# Patient Record
Sex: Female | Born: 1985 | Race: Black or African American | Hispanic: Yes | Marital: Married | Smoking: Current every day smoker
Health system: Southern US, Community
[De-identification: ages and names within clinical notes are randomized; demographics above are authoritative.]

## PROBLEM LIST (undated history)

## (undated) DIAGNOSIS — M5412 Radiculopathy, cervical region: Secondary | ICD-10-CM

## (undated) DIAGNOSIS — M25861 Other specified joint disorders, right knee: Secondary | ICD-10-CM

## (undated) DIAGNOSIS — I10 Essential (primary) hypertension: Secondary | ICD-10-CM

## (undated) DIAGNOSIS — M1711 Unilateral primary osteoarthritis, right knee: Secondary | ICD-10-CM

## (undated) DIAGNOSIS — F419 Anxiety disorder, unspecified: Secondary | ICD-10-CM

## (undated) DIAGNOSIS — O139 Gestational [pregnancy-induced] hypertension without significant proteinuria, unspecified trimester: Secondary | ICD-10-CM

## (undated) DIAGNOSIS — S83271A Complex tear of lateral meniscus, current injury, right knee, initial encounter: Secondary | ICD-10-CM

## (undated) DIAGNOSIS — S63629A Sprain of interphalangeal joint of unspecified thumb, initial encounter: Secondary | ICD-10-CM

## (undated) DIAGNOSIS — S6992XA Unspecified injury of left wrist, hand and finger(s), initial encounter: Secondary | ICD-10-CM

## (undated) DIAGNOSIS — Z789 Other specified health status: Secondary | ICD-10-CM

## (undated) DIAGNOSIS — M25542 Pain in joints of left hand: Secondary | ICD-10-CM

## (undated) HISTORY — DX: Essential (primary) hypertension: I10

## (undated) HISTORY — PX: FRACTURE SURGERY: SHX138

## (undated) HISTORY — PX: CYSTECTOMY: SUR359

## (undated) HISTORY — DX: Anxiety disorder, unspecified: F41.9

---
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
## 2016-11-19 ENCOUNTER — Emergency Department: Payer: Managed Care, Other (non HMO)

## 2016-11-19 ENCOUNTER — Emergency Department
Admission: EM | Admit: 2016-11-19 | Discharge: 2016-11-19 | Disposition: A | Discharge status: Home / Self Care | Payer: Managed Care, Other (non HMO) | Attending: Student in an Organized Health Care Education/Training Program | Admitting: Student in an Organized Health Care Education/Training Program

## 2016-11-19 ENCOUNTER — Encounter: Payer: Self-pay | Admitting: *Deleted

## 2016-11-19 DIAGNOSIS — Y939 Activity, unspecified: Secondary | ICD-10-CM | POA: Diagnosis not present

## 2016-11-19 DIAGNOSIS — X501XXA Overexertion from prolonged static or awkward postures, initial encounter: Secondary | ICD-10-CM | POA: Insufficient documentation

## 2016-11-19 DIAGNOSIS — S92211A Displaced fracture of cuboid bone of right foot, initial encounter for closed fracture: Secondary | ICD-10-CM | POA: Diagnosis not present

## 2016-11-19 DIAGNOSIS — S99911A Unspecified injury of right ankle, initial encounter: Secondary | ICD-10-CM | POA: Diagnosis present

## 2016-11-19 DIAGNOSIS — Y999 Unspecified external cause status: Secondary | ICD-10-CM | POA: Diagnosis not present

## 2016-11-19 DIAGNOSIS — Y929 Unspecified place or not applicable: Secondary | ICD-10-CM | POA: Insufficient documentation

## 2016-11-19 MED ORDER — HYDROCODONE-ACETAMINOPHEN 5-325 MG PO TABS: 1.0000 | ORAL_TABLET | Freq: Four times a day (QID) | ORAL | 0 refills | Status: DC | PRN

## 2016-11-19 MED ORDER — KETOROLAC TROMETHAMINE 30 MG/ML IJ SOLN
30.0000 mg | Freq: Once | INTRAMUSCULAR | Status: AC
  Administered 2016-11-19: 30 mg via INTRAMUSCULAR
  Filled 2016-11-19: qty 1

## 2016-11-19 NOTE — ED Notes (Signed)
See triage note  States she slipped last pm twisted right ankle/foot  Swelling noted across top of foot  positive pulses

## 2016-11-19 NOTE — ED Notes (Signed)
NAD noted at time of D/C. Pt taken to the lobby via wheelchair by this RN.

## 2016-11-19 NOTE — ED Triage Notes (Signed)
States she rolled her ankle last night while taking the trash out, states right ankle pain at present

## 2016-11-19 NOTE — ED Provider Notes (Signed)
[facility] Emergency Department Provider Note  ____________________________________________  Time seen: Approximately 11:27 AM  I have reviewed the triage vital signs and the nursing notes.   HISTORY  Chief Complaint Ankle Pain    HPI Melissa Dixon is a 31 y.o. female , NAD, presents to the emergency department for evaluation of right ankle and foot pain. Patient states she twisted her ankle last night around 6 PM. Since that time has had swelling, bruising and significant pain about the right ankle and top of her foot. Denies any numbness, weakness or tingling. Has not noted any open wounds or lacerations. Denies any visual changes, chest pain, shortness breath, lightheadedness or dizziness to cause the injury and states the injury was mechanical in nature. Has not been able bear weight without significant pain since the injury.   History reviewed. No pertinent past medical history.  There are no active problems to display for this patient.   No past surgical history on file.  Prior to Admission medications   Medication Sig Start Date End Date Taking? Authorizing Provider  HYDROcodone-acetaminophen (NORCO) 5-325 MG tablet Take 1 tablet by mouth every 6 (six) hours as needed for severe pain. 11/19/16   Jami L Hagler, PA-C    Allergies Patient has no known allergies.  History reviewed. No pertinent family history.  Social History Social History  Substance Use Topics  . Smoking status: Not on file  . Smokeless tobacco: Not on file  . Alcohol use Not on file     Review of Systems  Constitutional: No fatigue Eyes: No visual changes. Musculoskeletal: Positive for right ankle and foot pain. Negative for back, hip, knee pain.  Skin: Positive swelling and bruising right foot and ankle.  Negative for rash, redness, abnormal warmth, open wounds or lacerations. Neurological: Negative for numbness, weakness,  tingling.  ____________________________________________   PHYSICAL EXAM:  VITAL SIGNS: ED Triage Vitals  Enc Vitals Group     BP 11/19/16 0957 128/72     Pulse Rate 11/19/16 0957 92     Resp 11/19/16 0957 18     Temp 11/19/16 0957 98.8 F (37.1 C)     Temp Source 11/19/16 0957 Oral     SpO2 11/19/16 0957 98 %     Weight 11/19/16 0954 240 lb (108.9 kg)     Height 11/19/16 0954 5\' 7"  (1.702 m)     Head Circumference --      Peak Flow --      Pain Score 11/19/16 0954 10     Pain Loc --      Pain Edu? --      Excl. in [place]? --      Constitutional: Alert and oriented. Well appearing and in no acute distress. Eyes: Conjunctivae are normal.  Head: Atraumatic. Cardiovascular: Good peripheral circulation with 2+ pulses noted in the right lower extremity. Capillary refill is brisk in all digits of the right foot. Respiratory: Normal respiratory effort without tachypnea or retractions.  Musculoskeletal: Tenderness to palpation about the lateral malleolus and proximal and lateral portion of the right foot. Decreased range of motion of the right ankle due to pain and swelling. No crepitus to palpation of the ankle or foot. No laxity with anterior or posterior 4 of the right ankle. No laxity with varus or valgus stress of the right ankle. Range of motion of the toes on the right foot without difficulty. No lower leg tenderness nor edema.  No joint effusions. Neurologic:  Normal speech and  language. No gross focal neurologic deficits are appreciated. Light touch grossly intact about the right lower extremity. Skin:  Skin about the lateral, proximal and dorsal portion of the right foot with diffuse blue ecchymosis and swelling. No open wounds or lacerations. No skin sores. Skin is warm, dry and intact. No rash noted. Psychiatric: Mood and affect are normal. Speech and behavior are normal. Patient exhibits appropriate insight and judgement.   ____________________________________________    LABS  None ____________________________________________  EKG  None ____________________________________________  RADIOLOGY I, Hodgeman, personally viewed and evaluated these images (plain radiographs) as part of my medical decision making, as well as reviewing the written report by the radiologist.  Dg Ankle Complete Right  Result Date: 11/19/2016 CLINICAL DATA:  Pain following twisting injury EXAM: RIGHT ANKLE - COMPLETE 3+ VIEW COMPARISON:  None. FINDINGS: Frontal, oblique, and lateral views were obtained. There is a linear focus of calcification between the cuboid and fifth metatarsal, seen only on the lateral view. A small avulsion in this area must be of concern. No other evidence of potential fracture. No appreciable joint effusion. The ankle mortise appears intact. No appreciable joint space narrowing. IMPRESSION: Questionable avulsion lateral to the cuboid seen only on the lateral view. No other evidence of potential fracture. Ankle mortise appears intact. No appreciable joint space narrowing or erosion. Electronically Signed   By: Lowella Grip III M.D.   On: 11/19/2016 10:54    ____________________________________________    PROCEDURES  Procedure(s) performed: None   Procedures   Medications  ketorolac (TORADOL) 30 MG/ML injection 30 mg (30 mg Intramuscular Given 11/19/16 1153)     ____________________________________________   INITIAL IMPRESSION / ASSESSMENT AND PLAN / ED COURSE  Pertinent labs & imaging results that were available during my care of the patient were reviewed by me and considered in my medical decision making (see chart for details).     Patient's diagnosis is consistent with closed displaced fracture of the cuboid of the right foot. Patient was placed in a posterior splint and given crutches for supportive care. Patient will be discharged home with prescriptions for Norco to take sparingly as needed for pain. Patient is to follow up  with Dr. Roland Rack in orthopedics in 2-3 days for further evaluation and treatment of fracture. Patient is given ED precautions to return to the ED for any worsening or new symptoms.    ____________________________________________  FINAL CLINICAL IMPRESSION(S) / ED DIAGNOSES  Final diagnoses:  Closed displaced fracture of cuboid of right foot, initial encounter      NEW MEDICATIONS STARTED DURING THIS VISIT:  Discharge Medication List as of 11/19/2016 12:38 PM    START taking these medications   Details  HYDROcodone-acetaminophen (NORCO) 5-325 MG tablet Take 1 tablet by mouth every 6 (six) hours as needed for severe pain., Starting Wed 11/19/2016, [place], PA-C 11/19/16 1306    Merlyn Lot, MD 11/19/16 1357

## 2018-04-27 ENCOUNTER — Other Ambulatory Visit: Payer: Self-pay | Admitting: Orthopedic Surgery

## 2018-04-27 DIAGNOSIS — R609 Edema, unspecified: Secondary | ICD-10-CM

## 2018-04-27 DIAGNOSIS — R229 Localized swelling, mass and lump, unspecified: Secondary | ICD-10-CM

## 2018-04-27 DIAGNOSIS — M7989 Other specified soft tissue disorders: Secondary | ICD-10-CM

## 2018-04-27 DIAGNOSIS — R2241 Localized swelling, mass and lump, right lower limb: Secondary | ICD-10-CM

## 2018-04-27 DIAGNOSIS — IMO0002 Reserved for concepts with insufficient information to code with codable children: Secondary | ICD-10-CM

## 2018-04-29 ENCOUNTER — Ambulatory Visit
Admission: RE | Admit: 2018-04-29 | Discharge: 2018-04-29 | Disposition: A | Discharge status: Home / Self Care | Payer: 59 | Source: Ambulatory Visit | Attending: Orthopedic Surgery | Admitting: Orthopedic Surgery

## 2018-04-29 DIAGNOSIS — R2241 Localized swelling, mass and lump, right lower limb: Secondary | ICD-10-CM | POA: Insufficient documentation

## 2018-04-29 DIAGNOSIS — M7989 Other specified soft tissue disorders: Secondary | ICD-10-CM

## 2019-01-24 ENCOUNTER — Emergency Department: Payer: 59

## 2019-01-24 ENCOUNTER — Emergency Department
Admission: EM | Admit: 2019-01-24 | Discharge: 2019-01-24 | Disposition: A | Discharge status: Home / Self Care | Payer: 59 | Attending: Student in an Organized Health Care Education/Training Program | Admitting: Student in an Organized Health Care Education/Training Program

## 2019-01-24 ENCOUNTER — Encounter: Payer: Self-pay | Admitting: Emergency Medicine

## 2019-01-24 DIAGNOSIS — F17200 Nicotine dependence, unspecified, uncomplicated: Secondary | ICD-10-CM | POA: Insufficient documentation

## 2019-01-24 DIAGNOSIS — R1033 Periumbilical pain: Secondary | ICD-10-CM | POA: Diagnosis present

## 2019-01-24 DIAGNOSIS — L03316 Cellulitis of umbilicus: Secondary | ICD-10-CM | POA: Insufficient documentation

## 2019-01-24 LAB — CBC WITH DIFFERENTIAL/PLATELET
Abs Immature Granulocytes: 0.06 10*3/uL (ref 0.00–0.07)
Basophils Absolute: 0 10*3/uL (ref 0.0–0.1)
Basophils Relative: 0 %
Eosinophils Absolute: 0.3 10*3/uL (ref 0.0–0.5)
Eosinophils Relative: 2 %
HCT: 40.4 % (ref 36.0–46.0)
Hemoglobin: 13.2 g/dL (ref 12.0–15.0)
Immature Granulocytes: 0 %
Lymphocytes Relative: 26 %
Lymphs Abs: 3.6 10*3/uL (ref 0.7–4.0)
MCH: 26.7 pg (ref 26.0–34.0)
MCHC: 32.7 g/dL (ref 30.0–36.0)
MCV: 81.6 fL (ref 80.0–100.0)
Monocytes Absolute: 0.8 10*3/uL (ref 0.1–1.0)
Monocytes Relative: 6 %
Neutro Abs: 9.4 10*3/uL — ABNORMAL HIGH (ref 1.7–7.7)
Neutrophils Relative %: 66 %
Platelets: 241 10*3/uL (ref 150–400)
RBC: 4.95 MIL/uL (ref 3.87–5.11)
RDW: 15.2 % (ref 11.5–15.5)
WBC: 14.2 10*3/uL — ABNORMAL HIGH (ref 4.0–10.5)
nRBC: 0 % (ref 0.0–0.2)

## 2019-01-24 LAB — URINALYSIS, COMPLETE (UACMP) WITH MICROSCOPIC
Bilirubin Urine: NEGATIVE
Glucose, UA: NEGATIVE mg/dL
Ketones, ur: NEGATIVE mg/dL
Nitrite: NEGATIVE
Protein, ur: NEGATIVE mg/dL
Specific Gravity, Urine: 1.024 (ref 1.005–1.030)
pH: 5 (ref 5.0–8.0)

## 2019-01-24 LAB — COMPREHENSIVE METABOLIC PANEL
ALT: 17 U/L (ref 0–44)
AST: 17 U/L (ref 15–41)
Albumin: 4.6 g/dL (ref 3.5–5.0)
Alkaline Phosphatase: 71 U/L (ref 38–126)
Anion gap: 10 (ref 5–15)
BUN: 17 mg/dL (ref 6–20)
CO2: 24 mmol/L (ref 22–32)
Calcium: 9.4 mg/dL (ref 8.9–10.3)
Chloride: 102 mmol/L (ref 98–111)
Creatinine, Ser: 0.78 mg/dL (ref 0.44–1.00)
GFR calc Af Amer: >60 mL/min (ref >60)
GFR calc non Af Amer: >60 mL/min (ref >60)
Glucose, Bld: 91 mg/dL (ref 70–99)
Potassium: 3.4 mmol/L — ABNORMAL LOW (ref 3.5–5.1)
Sodium: 136 mmol/L (ref 135–145)
Total Bilirubin: 0.4 mg/dL (ref 0.3–1.2)
Total Protein: 8.3 g/dL — ABNORMAL HIGH (ref 6.5–8.1)

## 2019-01-24 LAB — LACTIC ACID, PLASMA: Lactic Acid, Venous: 0.7 mmol/L (ref 0.5–1.9)

## 2019-01-24 LAB — POCT PREGNANCY, URINE: Preg Test, Ur: NEGATIVE

## 2019-01-24 MED ORDER — CLINDAMYCIN HCL 300 MG PO CAPS: 300.0000 mg | ORAL_CAPSULE | Freq: Four times a day (QID) | ORAL | 0 refills | Status: DC

## 2019-01-24 MED ORDER — IOPAMIDOL (ISOVUE-300) INJECTION 61%
30.0000 mL | Freq: Once | INTRAVENOUS | Status: DC
  Filled 2019-01-24: qty 30

## 2019-01-24 MED ORDER — HYDROCODONE-ACETAMINOPHEN 5-325 MG PO TABS: 1.0000 | ORAL_TABLET | ORAL | 0 refills | Status: DC | PRN

## 2019-01-24 MED ORDER — CLINDAMYCIN PHOSPHATE 600 MG/50ML IV SOLN
600.0000 mg | Freq: Once | INTRAVENOUS | Status: AC
  Administered 2019-01-24: 20:00:00 600 mg via INTRAVENOUS
  Filled 2019-01-24: qty 50

## 2019-01-24 MED ORDER — IOHEXOL 300 MG/ML  SOLN
125.0000 mL | Freq: Once | INTRAMUSCULAR | Status: AC | PRN
  Administered 2019-01-24: 125 mL via INTRAVENOUS
  Filled 2019-01-24: qty 125

## 2019-01-24 MED ORDER — IOHEXOL 240 MG/ML SOLN
50.0000 mL | Freq: Once | INTRAMUSCULAR | Status: AC | PRN
  Administered 2019-01-24: 20:00:00 50 mL via ORAL
  Filled 2019-01-24: qty 50

## 2019-01-24 NOTE — ED Notes (Signed)
Patient states having discomfort out on navel over weekend, today started having some discharge that has a smell with clear. Patient went to Urgent care and was referred to ER.

## 2019-01-24 NOTE — ED Provider Notes (Signed)
[facility] Emergency Department Provider Note  ____________________________________________  Time seen: Approximately 7:54 PM  I have reviewed the triage vital signs and the nursing notes.   HISTORY  Chief Complaint Abdominal Pain    HPI Melissa Dixon is a 33 y.o. female who presents emergency department complaining of periumbilical pain with purulent drainage from umbilicus.  Patient reports that she first noticed periumbilical pain beginning 3 days ago.  Patient reports that it was mild to begin with.  Pain has increased and over the past day she is also noticed purulent drainage from the umbilicus.  Patient reports pain with any bending at the waist, sitting down.  She can only be comfortable standing up or laying perfectly flat.  Patient denies any appreciable fever.  No nausea, vomiting, diarrhea or constipation.  No anorexia.  Patient denies any urinary symptoms.  No history of chronic abdominal complaints.  Patient reports the pain is both sharp as well as a aching sensation.  She denies any chance of pregnancy.         History reviewed. No pertinent past medical history.  There are no active problems to display for this patient.   History reviewed. No pertinent surgical history.  Prior to Admission medications   Medication Sig Start Date End Date Taking? Authorizing Provider  clindamycin (CLEOCIN) 300 MG capsule Take 1 capsule (300 mg total) by mouth 4 (four) times daily. 01/24/19   Isadore Palecek, Charline Bills, PA-C  HYDROcodone-acetaminophen (NORCO/VICODIN) 5-325 MG tablet Take 1 tablet by mouth every 4 (four) hours as needed for moderate pain. 01/24/19   Stalin Gruenberg, Charline Bills, PA-C    Allergies Patient has no known allergies.  History reviewed. No pertinent family history.  Social History Social History   Tobacco Use  . Smoking status: Current Every Day Smoker  . Smokeless tobacco: Never Used  Substance Use Topics  . Alcohol use: Not on file   . Drug use: Not on file     Review of Systems  Constitutional: No fever/chills Eyes: No visual changes.  Cardiovascular: no chest pain. Respiratory: no cough. No SOB. Gastrointestinal: Central/periumbilical abdominal pain.  No nausea, no vomiting.  No diarrhea.  No constipation. Genitourinary: Negative for dysuria. No hematuria Musculoskeletal: Negative for musculoskeletal pain. Skin: Negative for rash, abrasions, lacerations, ecchymosis.  Positive for purulent drainage from the umbilicus. Neurological: Negative for headaches, focal weakness or numbness. 10-point ROS otherwise negative.  ____________________________________________   PHYSICAL EXAM:  VITAL SIGNS: ED Triage Vitals [01/24/19 1928]  Enc Vitals Group     BP (!) 144/71     Pulse Rate 83     Resp 18     Temp 98.1 F (36.7 C)     Temp Source Oral     SpO2 99 %     Weight      Height      Head Circumference      Peak Flow      Pain Score      Pain Loc      Pain Edu?      Excl. in [place]?      Constitutional: Alert and oriented. Well appearing and in no acute distress. Eyes: Conjunctivae are normal. PERRL. EOMI. Head: Atraumatic. Neck: No stridor.   Hematological/Lymphatic/Immunilogical: No cervical lymphadenopathy. Cardiovascular: Normal rate, regular rhythm. Normal S1 and S2.  Good peripheral circulation. Respiratory: Normal respiratory effort without tachypnea or retractions. Lungs CTAB. Good air entry to the bases with no decreased or absent breath sounds. Gastrointestinal: Visualization of  the abdominal wall reveals no gross erythema or edema.  Visualization of the umbilicus reveals purulent drainage in the umbilicus.  No gross erythema or edema of the umbilicus.  No appreciable umbilical hernia.  Patient is very tender to palpation in the umbilicus topically.  Bowel sounds 4 quadrants.  Soft to palpation all quadrants.  Patient is tender to palpation in the periumbilical region extending into the right  lower quadrant.. No guarding or rigidity. No palpable masses. No distention. No CVA tenderness. Musculoskeletal: Full range of motion to all extremities. No gross deformities appreciated. Neurologic:  Normal speech and language. No gross focal neurologic deficits are appreciated.  Skin:  Skin is warm, dry and intact. No rash noted. Psychiatric: Mood and affect are normal. Speech and behavior are normal. Patient exhibits appropriate insight and judgement.   ____________________________________________   LABS (all labs ordered are listed, but only abnormal results are displayed)  Labs Reviewed  COMPREHENSIVE METABOLIC PANEL - Abnormal; Notable for the following components:      Result Value   Potassium 3.4 (*)    Total Protein 8.3 (*)    All other components within normal limits  CBC WITH DIFFERENTIAL/PLATELET - Abnormal; Notable for the following components:   WBC 14.2 (*)    Neutro Abs 9.4 (*)    All other components within normal limits  URINALYSIS, COMPLETE (UACMP) WITH MICROSCOPIC - Abnormal; Notable for the following components:   Color, Urine YELLOW (*)    APPearance HAZY (*)    Hgb urine dipstick SMALL (*)    Leukocytes,Ua TRACE (*)    Bacteria, UA RARE (*)    All other components within normal limits  CULTURE, BLOOD (ROUTINE X 2)  CULTURE, BLOOD (ROUTINE X 2)  LACTIC ACID, PLASMA  POC URINE PREG, ED  POCT PREGNANCY, URINE   ____________________________________________  EKG   ____________________________________________  RADIOLOGY I personally viewed and evaluated these images as part of my medical decision making, as well as reviewing the written report by the radiologist.  Ct Abdomen Pelvis W Contrast  Result Date: 01/24/2019 CLINICAL DATA:  Periumbilical pain with drainage for 2 days EXAM: CT ABDOMEN AND PELVIS WITH CONTRAST TECHNIQUE: Multidetector CT imaging of the abdomen and pelvis was performed using the standard protocol following bolus administration of  intravenous contrast. CONTRAST:  125 mL Omnipaque 300 COMPARISON:  None. FINDINGS: Lower chest: No acute abnormality. Hepatobiliary: Scattered small hypodensities are noted within the liver likely representing small cysts but too small for characterization. The gallbladder is within normal limits. Pancreas: Unremarkable. No pancreatic ductal dilatation or surrounding inflammatory changes. Spleen: Normal in size without focal abnormality. Adrenals/Urinary Tract: The adrenal glands are within. Kidneys are well visualized and within normal limits. No renal calculi are noted. No obstructive changes are seen. The bladder is partially distended. Stomach/Bowel: Stomach is within normal limits. Appendix appears normal. No evidence of bowel wall thickening, distention, or inflammatory changes. Vascular/Lymphatic: No significant vascular findings are present. No enlarged abdominal or pelvic lymph nodes. Reproductive: Uterus and bilateral adnexa are unremarkable. Essure coils are noted bilaterally. Other: No abdominal wall hernia or abnormality. No abdominopelvic ascites. No fluid collection is noted in the region of umbilicus. Musculoskeletal: No acute or significant osseous findings. IMPRESSION: No acute abnormality noted. Specifically no focal abnormality to correspond with the umbilical symptoms. Electronically Signed   By: Inez Catalina M.D.   On: 01/24/2019 21:39    ____________________________________________    PROCEDURES  Procedure(s) performed:    Procedures    Medications  clindamycin (CLEOCIN) IVPB 600 mg (0 mg Intravenous Stopped 01/24/19 2133)  iohexol (OMNIPAQUE) 240 MG/ML injection 50 mL (50 mLs Oral Contrast Given 01/24/19 2022)  iohexol (OMNIPAQUE) 300 MG/ML solution 125 mL (125 mLs Intravenous Contrast Given 01/24/19 2122)     ____________________________________________   INITIAL IMPRESSION / ASSESSMENT AND PLAN / ED COURSE  Pertinent labs & imaging results that were available during  my care of the patient were reviewed by me and considered in my medical decision making (see chart for details).  Review of the [place] CSRS was performed in accordance of the [place] prior to dispensing any controlled drugs.  Clinical Course as of Jan 24 2155  Mon Jan 24, 2019  2001 Patient presented to the emergency department complaining of periumbilical pain with purulent drainage from the umbilicus.  On exam, patient did have purulent drainage identified.  Patient has no erythema or signs of cellulitis in the periumbilical region.  No fluctuance with palpation in this region.  Palpation does reveal periumbilical tenderness as well as tenderness extending into the right lower quadrant.  Patient denies any fevers or chills, nausea or vomiting, diarrhea or constipation.  No urinary symptoms.  At this time, patient will be evaluated with labs, CT scan of the abdomen and pelvis.  Differential includes cellulitis, umbilical abscess, umbilical hernia with strangulation, gastritis, appendicitis.   [JC]    Clinical Course User Index [JC] Quade Ramirez, Charline Bills, PA-C          Patient's diagnosis is consistent with cellulitis of the umbilicus.  Patient presented to the emergency department with periumbilical pain, drainage from the umbilicus.  Patient did have tenderness to palpation with obvious purulent drainage.  Given patient's complaint, physical exam findings, patient had labs, imaging to ensure no deep abscess.  CT returns with reassuring results with no loculated fluid collection or signs of intra-abdominal infection.  Labs were otherwise reassuring.  Patient will be placed on clindamycin for umbilical cellulitis.  Small prescription of Vicodin for pain is also prescribed.  Follow-up with primary care as needed. Patient is given ED precautions to return to the ED for any worsening or new symptoms.     ____________________________________________  FINAL CLINICAL IMPRESSION(S) / ED DIAGNOSES  Final  diagnoses:  Cellulitis of umbilicus      NEW MEDICATIONS STARTED DURING THIS VISIT:  ED Discharge Orders         Ordered    clindamycin (CLEOCIN) 300 MG capsule  4 times daily     01/24/19 2154    HYDROcodone-acetaminophen (NORCO/VICODIN) 5-325 MG tablet  Every 4 hours PRN     01/24/19 2154              This chart was dictated using voice recognition software/Dragon. Despite best efforts to proofread, errors can occur which can change the meaning. Any change was purely unintentional.    Darletta Moll, PA-C 01/24/19 2156    Merlyn Lot, MD 01/24/19 2258

## 2019-01-24 NOTE — ED Triage Notes (Signed)
Pt reports pain inside of naval that started on Saturday and discharge noted by pt on Sunday. Foal odor on assessment to area. No redness but pain by pt when area pressed.

## 2019-01-29 LAB — CULTURE, BLOOD (ROUTINE X 2)
Culture: NO GROWTH
Culture: NO GROWTH
Special Requests: ADEQUATE
Special Requests: ADEQUATE

## 2019-07-14 ENCOUNTER — Other Ambulatory Visit: Payer: Self-pay

## 2019-07-14 ENCOUNTER — Encounter: Payer: Self-pay | Admitting: Emergency Medicine

## 2019-07-14 ENCOUNTER — Emergency Department
Admission: EM | Admit: 2019-07-14 | Discharge: 2019-07-14 | Disposition: A | Discharge status: Home / Self Care | Payer: 59 | Attending: Emergency Medicine | Admitting: Emergency Medicine

## 2019-07-14 DIAGNOSIS — F17210 Nicotine dependence, cigarettes, uncomplicated: Secondary | ICD-10-CM | POA: Insufficient documentation

## 2019-07-14 DIAGNOSIS — W2105XA Struck by basketball, initial encounter: Secondary | ICD-10-CM | POA: Diagnosis not present

## 2019-07-14 DIAGNOSIS — K0889 Other specified disorders of teeth and supporting structures: Secondary | ICD-10-CM | POA: Insufficient documentation

## 2019-07-14 MED ORDER — IBUPROFEN 600 MG PO TABS: 600.0000 mg | ORAL_TABLET | Freq: Three times a day (TID) | ORAL | 0 refills | Status: DC | PRN

## 2019-07-14 MED ORDER — AMOXICILLIN 875 MG PO TABS: 875.0000 mg | ORAL_TABLET | Freq: Two times a day (BID) | ORAL | 0 refills | Status: DC

## 2019-07-14 NOTE — Discharge Instructions (Signed)
Call today make an appointment with your dentist.  Begin taking the amoxicillin 875 twice daily for 10 days.  Ibuprofen is for pain as needed.  Discontinue or decrease smoking as this will help with your dental pain.
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
## 2019-10-31 ENCOUNTER — Emergency Department: Payer: 59

## 2019-10-31 ENCOUNTER — Other Ambulatory Visit: Payer: Self-pay

## 2019-10-31 ENCOUNTER — Emergency Department
Admission: EM | Admit: 2019-10-31 | Discharge: 2019-10-31 | Disposition: A | Discharge status: Home / Self Care | Payer: 59 | Attending: Emergency Medicine | Admitting: Emergency Medicine

## 2019-10-31 DIAGNOSIS — Y999 Unspecified external cause status: Secondary | ICD-10-CM | POA: Insufficient documentation

## 2019-10-31 DIAGNOSIS — Y9389 Activity, other specified: Secondary | ICD-10-CM | POA: Insufficient documentation

## 2019-10-31 DIAGNOSIS — Y92410 Unspecified street and highway as the place of occurrence of the external cause: Secondary | ICD-10-CM | POA: Diagnosis not present

## 2019-10-31 DIAGNOSIS — S161XXA Strain of muscle, fascia and tendon at neck level, initial encounter: Secondary | ICD-10-CM | POA: Insufficient documentation

## 2019-10-31 DIAGNOSIS — F17210 Nicotine dependence, cigarettes, uncomplicated: Secondary | ICD-10-CM | POA: Diagnosis not present

## 2019-10-31 DIAGNOSIS — S199XXA Unspecified injury of neck, initial encounter: Secondary | ICD-10-CM | POA: Diagnosis present

## 2019-10-31 MED ORDER — TRAMADOL HCL 50 MG PO TABS: 50.0000 mg | ORAL_TABLET | Freq: Two times a day (BID) | ORAL | 0 refills | Status: DC | PRN

## 2019-10-31 MED ORDER — CYCLOBENZAPRINE HCL 10 MG PO TABS: 10.0000 mg | ORAL_TABLET | Freq: Three times a day (TID) | ORAL | 0 refills | Status: DC | PRN

## 2019-10-31 MED ORDER — IBUPROFEN 600 MG PO TABS: 600.0000 mg | ORAL_TABLET | Freq: Three times a day (TID) | ORAL | 0 refills | Status: DC | PRN
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
## 2019-10-31 NOTE — Discharge Instructions (Signed)
Follow discharge care instruction take medication as directed.  Advised most likely may cause drowsiness.  Do not operate vehicles while taking muscle relaxers.
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
## 2020-05-11 DIAGNOSIS — G5603 Carpal tunnel syndrome, bilateral upper limbs: Secondary | ICD-10-CM | POA: Insufficient documentation

## 2020-07-25 ENCOUNTER — Other Ambulatory Visit: Payer: Self-pay

## 2020-07-25 ENCOUNTER — Encounter: Payer: Self-pay | Admitting: Psychiatry

## 2020-07-25 ENCOUNTER — Telehealth (INDEPENDENT_AMBULATORY_CARE_PROVIDER_SITE_OTHER): Payer: 59 | Admitting: Psychiatry

## 2020-07-25 DIAGNOSIS — R41840 Attention and concentration deficit: Secondary | ICD-10-CM

## 2020-07-25 DIAGNOSIS — F411 Generalized anxiety disorder: Secondary | ICD-10-CM | POA: Diagnosis not present

## 2020-07-25 DIAGNOSIS — F330 Major depressive disorder, recurrent, mild: Secondary | ICD-10-CM

## 2020-07-25 MED ORDER — FLUOXETINE HCL 20 MG PO CAPS: 20.0000 mg | ORAL_CAPSULE | Freq: Every day | ORAL | 1 refills | Status: DC
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
## 2020-07-27 ENCOUNTER — Encounter: Payer: Self-pay | Admitting: Psychology

## 2020-08-14 ENCOUNTER — Encounter: Payer: 59 | Attending: Psychology | Admitting: Psychology

## 2020-08-15 ENCOUNTER — Ambulatory Visit: Payer: 59 | Admitting: Licensed Clinical Social Worker

## 2020-08-15 ENCOUNTER — Other Ambulatory Visit: Payer: Self-pay

## 2020-08-17 ENCOUNTER — Other Ambulatory Visit: Payer: Self-pay | Admitting: Psychiatry

## 2020-08-17 DIAGNOSIS — F411 Generalized anxiety disorder: Secondary | ICD-10-CM

## 2020-08-17 DIAGNOSIS — F330 Major depressive disorder, recurrent, mild: Secondary | ICD-10-CM

## 2020-08-22 ENCOUNTER — Telehealth: Payer: Self-pay

## 2020-08-22 ENCOUNTER — Telehealth (INDEPENDENT_AMBULATORY_CARE_PROVIDER_SITE_OTHER): Payer: 59 | Admitting: Psychiatry

## 2020-08-22 ENCOUNTER — Other Ambulatory Visit: Payer: Self-pay

## 2020-08-22 DIAGNOSIS — Z5329 Procedure and treatment not carried out because of patient's decision for other reasons: Secondary | ICD-10-CM

## 2020-08-22 NOTE — Progress Notes (Signed)
No response to call or text or video invite  

## 2020-08-22 NOTE — Telephone Encounter (Signed)
pt no showed appt for ADHD testing with dr. Joelyn Oms that was set up for 08-14-20 @ 2

## 2020-08-24 ENCOUNTER — Ambulatory Visit (INDEPENDENT_AMBULATORY_CARE_PROVIDER_SITE_OTHER): Payer: 59 | Admitting: Licensed Clinical Social Worker

## 2020-08-24 ENCOUNTER — Other Ambulatory Visit: Payer: Self-pay

## 2020-08-24 DIAGNOSIS — Z5329 Procedure and treatment not carried out because of patient's decision for other reasons: Secondary | ICD-10-CM

## 2020-08-24 NOTE — Progress Notes (Signed)
LCSW counselor tried to connect with patient for scheduled appointment via MyChart video text request x 2 and email request; also tried to connect via phone without success.LCSW counselor could not leave message due to voicemail box full.

## 2021-07-03 ENCOUNTER — Emergency Department
Admission: EM | Admit: 2021-07-03 | Discharge: 2021-07-03 | Disposition: A | Discharge status: Home / Self Care | Payer: 59 | Attending: Emergency Medicine | Admitting: Emergency Medicine

## 2021-07-03 ENCOUNTER — Other Ambulatory Visit: Payer: Self-pay

## 2021-07-03 ENCOUNTER — Encounter: Payer: Self-pay | Admitting: Emergency Medicine

## 2021-07-03 DIAGNOSIS — R062 Wheezing: Secondary | ICD-10-CM | POA: Insufficient documentation

## 2021-07-03 DIAGNOSIS — R0602 Shortness of breath: Secondary | ICD-10-CM | POA: Diagnosis not present

## 2021-07-03 DIAGNOSIS — M549 Dorsalgia, unspecified: Secondary | ICD-10-CM | POA: Insufficient documentation

## 2021-07-03 DIAGNOSIS — R220 Localized swelling, mass and lump, head: Secondary | ICD-10-CM | POA: Diagnosis not present

## 2021-07-03 DIAGNOSIS — Z5321 Procedure and treatment not carried out due to patient leaving prior to being seen by health care provider: Secondary | ICD-10-CM | POA: Insufficient documentation

## 2021-07-03 DIAGNOSIS — R21 Rash and other nonspecific skin eruption: Secondary | ICD-10-CM | POA: Diagnosis not present

## 2021-07-03 DIAGNOSIS — T7840XA Allergy, unspecified, initial encounter: Secondary | ICD-10-CM | POA: Insufficient documentation

## 2021-07-03 DIAGNOSIS — F410 Panic disorder [episodic paroxysmal anxiety] without agoraphobia: Secondary | ICD-10-CM | POA: Insufficient documentation
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
## 2021-07-03 NOTE — ED Triage Notes (Addendum)
States while bending over sink cleaning dishes felt pain in mid back, states "I couldn't speak, couldn't move.  I think I had a panic attack"  Patient is animated.  Speaking rapidly.  No SOB/ DOE.  Voice clear and strong.  Currently only complaint is the left thoracic back pain

## 2021-07-03 NOTE — ED Provider Notes (Signed)
Emergency Medicine Provider Triage Evaluation Note  Melissa Dixon , a 35 y.o. female  was evaluated in triage.  Pt complains of back pain that started after she turned to the left, then turned back toward the sink. She believes she had a panic attack due to the pain and became short of breath and couldn't speak. Husband was concerned she was having an allergic reaction because she ate seafood last night. No history of shellfish allergy. Symptoms except back pain have resolved.   Review of Systems  Positive: Back pain Negative: Chest pain, shortness of breath.  Physical Exam  There were no vitals taken for this visit. Gen:   Awake, no distress   Resp:  Normal effort  MSK:   Moves extremities without difficulty  Other:    Medical Decision Making  Medically screening exam initiated at 9:27 AM.  Appropriate orders placed.  Melissa Dixon was informed that the remainder of the evaluation will be completed by another provider, this initial triage assessment does not replace that evaluation, and the importance of remaining in the ED until their evaluation is complete.    Victorino Dike, FNP 07/03/21 1245    Lucrezia Starch, MD 07/03/21 [phone]

## 2021-07-03 NOTE — ED Triage Notes (Signed)
Pt comes into the ED via EMS from home , c/o sudden onset SOB . 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 EMS pt tachypenic on there arrival with wheezing.   Benadryl 50mg  PO Pepcid 20mg  IV Epi IM [id number] 100%RA 152/98 #20gRhand

## 2021-10-07 ENCOUNTER — Emergency Department: Payer: 59

## 2021-10-07 ENCOUNTER — Encounter: Payer: Self-pay | Admitting: Emergency Medicine

## 2021-10-07 ENCOUNTER — Other Ambulatory Visit: Payer: Self-pay

## 2021-10-07 ENCOUNTER — Emergency Department
Admission: EM | Admit: 2021-10-07 | Discharge: 2021-10-07 | Disposition: A | Discharge status: Home / Self Care | Payer: 59 | Attending: Emergency Medicine | Admitting: Emergency Medicine

## 2021-10-07 DIAGNOSIS — S0181XA Laceration without foreign body of other part of head, initial encounter: Secondary | ICD-10-CM | POA: Diagnosis present

## 2021-10-07 DIAGNOSIS — Z23 Encounter for immunization: Secondary | ICD-10-CM | POA: Insufficient documentation

## 2021-10-07 DIAGNOSIS — Z79899 Other long term (current) drug therapy: Secondary | ICD-10-CM | POA: Insufficient documentation

## 2021-10-07 DIAGNOSIS — S00452A Superficial foreign body of left ear, initial encounter: Secondary | ICD-10-CM

## 2021-10-07 DIAGNOSIS — T162XXA Foreign body in left ear, initial encounter: Secondary | ICD-10-CM | POA: Diagnosis not present

## 2021-10-07 DIAGNOSIS — I10 Essential (primary) hypertension: Secondary | ICD-10-CM | POA: Insufficient documentation

## 2021-10-07 DIAGNOSIS — F17210 Nicotine dependence, cigarettes, uncomplicated: Secondary | ICD-10-CM | POA: Insufficient documentation

## 2021-10-07 DIAGNOSIS — Y92009 Unspecified place in unspecified non-institutional (private) residence as the place of occurrence of the external cause: Secondary | ICD-10-CM | POA: Insufficient documentation

## 2021-10-07 MED ORDER — IBUPROFEN 600 MG PO TABS
600.0000 mg | ORAL_TABLET | Freq: Once | ORAL | Status: AC
  Administered 2021-10-07: 08:00:00 600 mg via ORAL
  Filled 2021-10-07: qty 1

## 2021-10-07 MED ORDER — TRAMADOL HCL 50 MG PO TABS: 50.0000 mg | ORAL_TABLET | Freq: Four times a day (QID) | ORAL | 0 refills | Status: DC | PRN

## 2021-10-07 MED ORDER — TETANUS-DIPHTH-ACELL PERTUSSIS 5-2.5-18.5 LF-MCG/0.5 IM SUSY
0.5000 mL | PREFILLED_SYRINGE | Freq: Once | INTRAMUSCULAR | Status: AC
  Administered 2021-10-07: 08:00:00 0.5 mL via INTRAMUSCULAR
  Filled 2021-10-07: qty 0.5

## 2021-10-07 MED ORDER — BACITRACIN-NEOMYCIN-POLYMYXIN 400-5-5000 EX OINT
TOPICAL_OINTMENT | Freq: Once | CUTANEOUS | Status: DC
  Filled 2021-10-07: qty 1

## 2021-10-07 MED ORDER — BACLOFEN 10 MG PO TABS: 10.0000 mg | ORAL_TABLET | Freq: Three times a day (TID) | ORAL | 0 refills | Status: AC

## 2021-10-07 MED ORDER — CEPHALEXIN 500 MG PO CAPS: 500.0000 mg | ORAL_CAPSULE | Freq: Three times a day (TID) | ORAL | 0 refills | Status: DC

## 2021-10-07 MED ORDER — ACETAMINOPHEN 325 MG PO TABS: 650.0000 mg | ORAL_TABLET | Freq: Once | ORAL | Status: DC

## 2021-10-07 MED ORDER — LIDOCAINE HCL (PF) 1 % IJ SOLN
5.0000 mL | Freq: Once | INTRAMUSCULAR | Status: AC
  Administered 2021-10-07: 09:00:00 5 mL via INTRADERMAL
  Filled 2021-10-07: qty 5

## 2021-10-07 MED ORDER — ACETAMINOPHEN 325 MG PO TABS
ORAL_TABLET | ORAL | Status: AC
  Filled 2021-10-07: qty 2

## 2021-10-07 NOTE — Discharge Instructions (Signed)
Keep the areas clean and dry as possible. Return to the emergency department, urgent care, or your regular doctor to have sutures removed in 1 week After 24 hours she can clean the area very gently with soap and water. Once the sutures are removed use sunscreen on the area for 1 year so the pigment will not change and decrease scarring. Also use cocoa butter or Moderma
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
## 2021-10-07 NOTE — ED Notes (Signed)
Patient discharged to home per MD order. Patient in stable condition, and deemed medically cleared by ED provider for discharge. Discharge instructions reviewed with patient/family using "Teach Back"; verbalized understanding of medication education and administration, and information about follow-up care. Denies further concerns. ° °
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
## 2022-03-12 ENCOUNTER — Emergency Department: Admission: EM | Admit: 2022-03-12 | Discharge: 2022-03-12 | Discharge status: Against Medical Advice | Payer: 59

## 2022-03-12 ENCOUNTER — Emergency Department
Admission: EM | Admit: 2022-03-12 | Discharge: 2022-03-12 | Disposition: A | Discharge status: Home / Self Care | Payer: 59 | Attending: Emergency Medicine | Admitting: Emergency Medicine

## 2022-03-12 ENCOUNTER — Encounter: Payer: Self-pay | Admitting: *Deleted

## 2022-03-12 ENCOUNTER — Other Ambulatory Visit: Payer: Self-pay

## 2022-03-12 DIAGNOSIS — R519 Headache, unspecified: Secondary | ICD-10-CM | POA: Diagnosis present

## 2022-03-12 DIAGNOSIS — D234 Other benign neoplasm of skin of scalp and neck: Secondary | ICD-10-CM | POA: Diagnosis not present

## 2022-03-12 DIAGNOSIS — M5412 Radiculopathy, cervical region: Secondary | ICD-10-CM

## 2022-03-12 DIAGNOSIS — M542 Cervicalgia: Secondary | ICD-10-CM

## 2022-03-12 LAB — CBC
HCT: 39 % (ref 36.0–46.0)
Hemoglobin: 12.5 g/dL (ref 12.0–15.0)
MCH: 26.3 pg (ref 26.0–34.0)
MCHC: 32.1 g/dL (ref 30.0–36.0)
MCV: 82.1 fL (ref 80.0–100.0)
Platelets: 258 10*3/uL (ref 150–400)
RBC: 4.75 MIL/uL (ref 3.87–5.11)
RDW: 15.5 % (ref 11.5–15.5)
WBC: 12.4 10*3/uL — ABNORMAL HIGH (ref 4.0–10.5)
nRBC: 0 % (ref 0.0–0.2)

## 2022-03-12 LAB — BASIC METABOLIC PANEL
Anion gap: 7 (ref 5–15)
BUN: 12 mg/dL (ref 6–20)
CO2: 27 mmol/L (ref 22–32)
Calcium: 9.4 mg/dL (ref 8.9–10.3)
Chloride: 106 mmol/L (ref 98–111)
Creatinine, Ser: 0.74 mg/dL (ref 0.44–1.00)
GFR, Estimated: >60 mL/min (ref >60)
Glucose, Bld: 109 mg/dL — ABNORMAL HIGH (ref 70–99)
Potassium: 3.4 mmol/L — ABNORMAL LOW (ref 3.5–5.1)
Sodium: 140 mmol/L (ref 135–145)

## 2022-03-12 MED ORDER — PREDNISONE 10 MG (21) PO TBPK: ORAL_TABLET | ORAL | 0 refills | Status: DC

## 2022-03-12 MED ORDER — BUTALBITAL-APAP-CAFFEINE 50-325-40 MG PO TABS
2.0000 | ORAL_TABLET | Freq: Once | ORAL | Status: AC
  Administered 2022-03-12: 2 via ORAL
  Filled 2022-03-12: qty 2

## 2022-03-12 MED ORDER — BUTALBITAL-APAP-CAFFEINE 50-325-40 MG PO TABS: 1.0000 | ORAL_TABLET | Freq: Four times a day (QID) | ORAL | 0 refills | Status: DC | PRN

## 2022-03-12 NOTE — ED Notes (Signed)
Pt not experiencing any stroke like sx's at this time. Ambulatory to desk stating that she was trying to be seen on her lunch break and now she needs to leave. Pt seen walking out of lobby without difficulty.
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
## 2022-03-12 NOTE — ED Triage Notes (Signed)
Pt has a headache for 1 month.  Pt reports numbness in face 1 month ago also.  Pt taking bc's and motrin without relief.  No  n/v  pt alert  speech clear.  Pt ambulates without diff.

## 2022-03-20 ENCOUNTER — Encounter: Payer: Self-pay | Admitting: Neurology

## 2022-04-16 ENCOUNTER — Other Ambulatory Visit: Payer: Self-pay | Admitting: Student

## 2022-04-16 DIAGNOSIS — G8929 Other chronic pain: Secondary | ICD-10-CM

## 2022-04-16 DIAGNOSIS — R200 Anesthesia of skin: Secondary | ICD-10-CM

## 2022-04-29 ENCOUNTER — Ambulatory Visit
Admission: RE | Admit: 2022-04-29 | Discharge: 2022-04-29 | Disposition: A | Discharge status: Home / Self Care | Payer: 59 | Source: Ambulatory Visit | Attending: Student | Admitting: Student

## 2022-04-29 ENCOUNTER — Ambulatory Visit: Payer: 59

## 2022-04-29 DIAGNOSIS — G8929 Other chronic pain: Secondary | ICD-10-CM | POA: Diagnosis present

## 2022-04-29 DIAGNOSIS — R200 Anesthesia of skin: Secondary | ICD-10-CM | POA: Insufficient documentation

## 2022-04-29 MED ORDER — GADOBUTROL 1 MMOL/ML IV SOLN
10.0000 mL | Freq: Once | INTRAVENOUS | Status: AC | PRN
  Administered 2022-04-29: 10 mL via INTRAVENOUS
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
## 2022-06-17 ENCOUNTER — Ambulatory Visit (INDEPENDENT_AMBULATORY_CARE_PROVIDER_SITE_OTHER): Payer: 59 | Admitting: Neurology

## 2022-06-17 ENCOUNTER — Encounter: Payer: Self-pay | Admitting: Neurology

## 2022-06-17 VITALS — BP 115/71 | HR 89 | Ht 66.0 in | Wt 250.6 lb

## 2022-06-17 DIAGNOSIS — G43709 Chronic migraine without aura, not intractable, without status migrainosus: Secondary | ICD-10-CM | POA: Diagnosis not present

## 2022-06-17 DIAGNOSIS — M5412 Radiculopathy, cervical region: Secondary | ICD-10-CM

## 2022-06-17 MED ORDER — TOPIRAMATE 25 MG PO TABS: 25.0000 mg | ORAL_TABLET | Freq: Every day | ORAL | 5 refills | Status: DC

## 2022-06-17 MED ORDER — CYCLOBENZAPRINE HCL 10 MG PO TABS: 10.0000 mg | ORAL_TABLET | Freq: Three times a day (TID) | ORAL | 3 refills | Status: DC | PRN

## 2022-06-17 NOTE — Patient Instructions (Signed)
  Refer to physical therapy for cervical radiculopathy Start topiramate '25mg'$  at bedtime.  If no improvement in headaches in 4 weeks, contact me and we can increase dose Take sumatriptan tablet at earliest onset of headache.  May repeat dose once in 2 hours if needed.  Maximum 2 tablets in 24 hours. Take cyclobenzaprine '10mg'$  at earliest onset of neck spasm.  Caution for drowsiness Limit use of pain relievers to no more than 2 days out of the week.  These medications include acetaminophen, NSAIDs (ibuprofen/Advil/Motrin, naproxen/Aleve, triptans (Imitrex/sumatriptan), Excedrin, and narcotics.  This will help reduce risk of rebound headaches. Be aware of common food triggers:  - Caffeine:  coffee, black tea, cola, Mt. Dew  - Chocolate  - Dairy:  aged cheeses (brie, blue, cheddar, gouda, [place], provolone, [place], Swiss, etc), chocolate milk, buttermilk, sour cream, limit eggs and yogurt  - Nuts, peanut butter  - Alcohol  - Cereals/grains:  FRESH breads (fresh bagels, sourdough, doughnuts), yeast productions  - Processed/canned/aged/cured meats (pre-packaged deli meats, hotdogs)  - MSG/glutamate:  soy sauce, flavor enhancer, pickled/preserved/marinated foods  - Sweeteners:  aspartame (Equal, Nutrasweet).  Sugar and Splenda are okay  - Vegetables:  legumes (lima beans, lentils, snow peas, fava beans, pinto peans, peas, garbanzo beans), sauerkraut, onions, olives, pickles  - Fruit:  avocados, bananas, citrus fruit (orange, lemon, grapefruit), mango  - Other:  Frozen meals, macaroni and cheese Routine exercise Stay adequately hydrated (aim for 64 oz water daily) Keep headache diary Maintain proper stress management Maintain proper sleep hygiene Do not skip meals Consider supplements:  magnesium citrate '400mg'$  daily, riboflavin '400mg'$  daily, coenzyme Q10 '100mg'$  three times daily.

## 2022-06-17 NOTE — Addendum Note (Signed)
Addended by: Venetia Night on: 06/17/2022 11:43 AM   Modules accepted: Orders

## 2022-07-11 ENCOUNTER — Telehealth: Payer: Self-pay | Admitting: Neurology

## 2022-07-11 NOTE — Telephone Encounter (Signed)
Pt called in stating she hasn't heard back from anyone about physical therapy in [place]. She says she could see someone in [place] if we can't find anyone in [place]. She would just like to get started.

## 2022-07-14 ENCOUNTER — Other Ambulatory Visit: Payer: Self-pay

## 2022-07-14 DIAGNOSIS — M5412 Radiculopathy, cervical region: Secondary | ICD-10-CM

## 2022-07-14 NOTE — Telephone Encounter (Signed)
[facility] and they are booking out unit November do I reached out [facility] here can get her scheduled in on e week called patient and she agreed and I have sent referral

## 2022-07-22 ENCOUNTER — Other Ambulatory Visit: Payer: Self-pay

## 2022-07-22 ENCOUNTER — Encounter: Payer: Self-pay | Admitting: Rehabilitation

## 2022-07-22 ENCOUNTER — Telehealth: Payer: Self-pay | Admitting: Rehabilitation

## 2022-07-22 ENCOUNTER — Ambulatory Visit: Payer: 59 | Attending: Family Medicine | Admitting: Rehabilitation

## 2022-07-22 DIAGNOSIS — M542 Cervicalgia: Secondary | ICD-10-CM | POA: Diagnosis not present

## 2022-07-22 DIAGNOSIS — M5412 Radiculopathy, cervical region: Secondary | ICD-10-CM

## 2022-07-22 DIAGNOSIS — R293 Abnormal posture: Secondary | ICD-10-CM | POA: Insufficient documentation

## 2022-07-22 DIAGNOSIS — M25511 Pain in right shoulder: Secondary | ICD-10-CM | POA: Diagnosis present

## 2022-07-22 DIAGNOSIS — M6281 Muscle weakness (generalized): Secondary | ICD-10-CM | POA: Insufficient documentation

## 2022-07-22 NOTE — Telephone Encounter (Signed)
Per Dr.Jaffe, Thank you,  Will send the referral.  Referral added and fax over to [facility] [phone].

## 2022-07-22 NOTE — Telephone Encounter (Signed)
Dr. Tomi Likens,   I just evaluated Melissa Dixon here at OP neuro today for cervical radiculopathy.  We will see her here for the time being, however she would like to be at [facility] or [facility] when able.  Could you send a referral there to at least get her on the wait list? Her son is at [facility] so she would also like to be there.   Cameron Sprang, PT, MPT [facility] [address] [place], [place], [address] Phone: [phone]   Fax:  [phone] 07/22/22, 1:15 PM
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
## 2022-07-22 NOTE — Patient Instructions (Signed)
Access Code: [email or web address] URL: [email or web address] Date: 07/22/2022 Prepared by: Cameron Sprang  Exercises - Seated Cervical Sidebending Stretch  - 1 x daily - 7 x weekly - 1 sets - 3 reps - 30 secs hold - Seated Levator Scapulae Stretch  - 1 x daily - 7 x weekly - 1 sets - 3 reps - 30 secs hold - Supine Chest Stretch on Foam Roll  - 1 x daily - 7 x weekly - 1 sets - 2 reps - 2 min hold

## 2022-07-29 ENCOUNTER — Encounter: Payer: Self-pay | Admitting: Physical Therapy

## 2022-07-29 ENCOUNTER — Ambulatory Visit: Payer: 59 | Admitting: Physical Therapy

## 2022-07-29 DIAGNOSIS — M5412 Radiculopathy, cervical region: Secondary | ICD-10-CM

## 2022-07-29 DIAGNOSIS — M25511 Pain in right shoulder: Secondary | ICD-10-CM

## 2022-07-29 DIAGNOSIS — M542 Cervicalgia: Secondary | ICD-10-CM

## 2022-07-29 DIAGNOSIS — M6281 Muscle weakness (generalized): Secondary | ICD-10-CM

## 2022-07-29 DIAGNOSIS — R293 Abnormal posture: Secondary | ICD-10-CM

## 2022-07-29 NOTE — Patient Instructions (Signed)
Access Code: [email or web address] URL: [email or web address] Date: 07/29/2022 Prepared by: Elease Etienne  Exercises - Seated Cervical Sidebending Stretch  - 1 x daily - 7 x weekly - 1 sets - 3 reps - 30 secs hold - Seated Levator Scapulae Stretch  - 1 x daily - 7 x weekly - 1 sets - 3 reps - 30 secs hold - Supine Chest Stretch on Foam Roll  - 1 x daily - 7 x weekly - 1 sets - 2 reps - 2 min hold - Seated Upper Trapezius Stretch  - 1 x daily - 7 x weekly - 1 sets - 3-4 reps - 30-45 sec hold - Sternocleidomastoid Stretch  - 1 x daily - 7 x weekly - 1 sets - 3-4 reps - 30-45 sec hold
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
## 2022-08-05 ENCOUNTER — Ambulatory Visit: Payer: 59 | Admitting: Physical Therapy

## 2022-08-05 ENCOUNTER — Encounter: Payer: Self-pay | Admitting: Physical Therapy

## 2022-08-05 DIAGNOSIS — M542 Cervicalgia: Secondary | ICD-10-CM | POA: Diagnosis not present

## 2022-08-05 DIAGNOSIS — M25511 Pain in right shoulder: Secondary | ICD-10-CM

## 2022-08-05 DIAGNOSIS — M6281 Muscle weakness (generalized): Secondary | ICD-10-CM

## 2022-08-05 DIAGNOSIS — M5412 Radiculopathy, cervical region: Secondary | ICD-10-CM

## 2022-08-05 DIAGNOSIS — R293 Abnormal posture: Secondary | ICD-10-CM
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
## 2022-08-13 ENCOUNTER — Ambulatory Visit: Payer: 59 | Attending: Family Medicine | Admitting: Rehabilitation

## 2022-08-13 ENCOUNTER — Encounter: Payer: Self-pay | Admitting: Rehabilitation

## 2022-08-13 DIAGNOSIS — R293 Abnormal posture: Secondary | ICD-10-CM | POA: Diagnosis present

## 2022-08-13 DIAGNOSIS — M6281 Muscle weakness (generalized): Secondary | ICD-10-CM | POA: Diagnosis present

## 2022-08-13 DIAGNOSIS — M542 Cervicalgia: Secondary | ICD-10-CM | POA: Diagnosis not present

## 2022-08-13 DIAGNOSIS — M25511 Pain in right shoulder: Secondary | ICD-10-CM | POA: Diagnosis present

## 2022-08-13 DIAGNOSIS — M5412 Radiculopathy, cervical region: Secondary | ICD-10-CM | POA: Diagnosis present
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
## 2022-08-15 DIAGNOSIS — Z0279 Encounter for issue of other medical certificate: Secondary | ICD-10-CM

## 2022-08-20 ENCOUNTER — Encounter: Payer: Self-pay | Admitting: Rehabilitation

## 2022-08-20 ENCOUNTER — Ambulatory Visit: Payer: 59 | Admitting: Rehabilitation

## 2022-08-20 DIAGNOSIS — M542 Cervicalgia: Secondary | ICD-10-CM

## 2022-08-20 DIAGNOSIS — M25511 Pain in right shoulder: Secondary | ICD-10-CM

## 2022-08-20 DIAGNOSIS — R293 Abnormal posture: Secondary | ICD-10-CM

## 2022-08-20 DIAGNOSIS — M5412 Radiculopathy, cervical region: Secondary | ICD-10-CM
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
## 2022-08-27 ENCOUNTER — Ambulatory Visit: Payer: 59 | Admitting: Physical Therapy

## 2022-08-27 DIAGNOSIS — M25511 Pain in right shoulder: Secondary | ICD-10-CM

## 2022-08-27 DIAGNOSIS — M542 Cervicalgia: Secondary | ICD-10-CM

## 2022-08-27 DIAGNOSIS — R293 Abnormal posture: Secondary | ICD-10-CM

## 2022-08-27 DIAGNOSIS — M6281 Muscle weakness (generalized): Secondary | ICD-10-CM

## 2022-08-27 DIAGNOSIS — M5412 Radiculopathy, cervical region: Secondary | ICD-10-CM
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
## 2022-09-10 ENCOUNTER — Ambulatory Visit: Payer: 59 | Admitting: Physical Therapy

## 2022-09-10 ENCOUNTER — Encounter: Payer: Self-pay | Admitting: Physical Therapy

## 2022-09-10 DIAGNOSIS — R293 Abnormal posture: Secondary | ICD-10-CM

## 2022-09-10 DIAGNOSIS — M6281 Muscle weakness (generalized): Secondary | ICD-10-CM

## 2022-09-10 DIAGNOSIS — M5412 Radiculopathy, cervical region: Secondary | ICD-10-CM

## 2022-09-10 DIAGNOSIS — M542 Cervicalgia: Secondary | ICD-10-CM | POA: Diagnosis not present

## 2022-09-10 DIAGNOSIS — M25511 Pain in right shoulder: Secondary | ICD-10-CM
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
## 2022-09-17 ENCOUNTER — Ambulatory Visit: Payer: 59 | Admitting: Physical Therapy
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
## 2022-09-24 ENCOUNTER — Ambulatory Visit: Payer: 59 | Attending: Family Medicine | Admitting: Physical Therapy

## 2022-09-24 DIAGNOSIS — M542 Cervicalgia: Secondary | ICD-10-CM | POA: Diagnosis present

## 2022-09-24 DIAGNOSIS — M6281 Muscle weakness (generalized): Secondary | ICD-10-CM | POA: Insufficient documentation
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
## 2022-10-01 ENCOUNTER — Ambulatory Visit: Payer: 59 | Admitting: Physical Therapy
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
## 2022-11-11 ENCOUNTER — Ambulatory Visit: Payer: 59 | Admitting: Neurology
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
## 2022-11-21 ENCOUNTER — Ambulatory Visit (INDEPENDENT_AMBULATORY_CARE_PROVIDER_SITE_OTHER): Payer: 59 | Admitting: Neurology

## 2022-11-21 ENCOUNTER — Encounter: Payer: Self-pay | Admitting: Neurology

## 2022-11-21 VITALS — BP 109/73 | HR 86 | Ht 67.0 in | Wt 264.4 lb

## 2022-11-21 DIAGNOSIS — M5412 Radiculopathy, cervical region: Secondary | ICD-10-CM

## 2022-11-21 DIAGNOSIS — G43109 Migraine with aura, not intractable, without status migrainosus: Secondary | ICD-10-CM | POA: Diagnosis not present

## 2023-03-18 ENCOUNTER — Ambulatory Visit: Payer: 59 | Admitting: Neurology
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
## 2023-04-21 ENCOUNTER — Ambulatory Visit: Payer: 59 | Admitting: Neurology

## 2023-04-21 DIAGNOSIS — Z029 Encounter for administrative examinations, unspecified: Secondary | ICD-10-CM

## 2023-04-22 ENCOUNTER — Encounter: Payer: Self-pay | Admitting: Neurology

## 2023-06-22 ENCOUNTER — Emergency Department: Payer: 59

## 2023-06-22 ENCOUNTER — Other Ambulatory Visit: Payer: Self-pay

## 2023-06-22 ENCOUNTER — Emergency Department
Admission: EM | Admit: 2023-06-22 | Discharge: 2023-06-22 | Disposition: A | Discharge status: Home / Self Care | Payer: 59 | Attending: Emergency Medicine | Admitting: Emergency Medicine

## 2023-06-22 DIAGNOSIS — M25561 Pain in right knee: Secondary | ICD-10-CM

## 2023-06-22 DIAGNOSIS — S8991XA Unspecified injury of right lower leg, initial encounter: Secondary | ICD-10-CM | POA: Diagnosis present

## 2023-06-22 DIAGNOSIS — S8391XA Sprain of unspecified site of right knee, initial encounter: Secondary | ICD-10-CM | POA: Diagnosis not present

## 2023-06-22 DIAGNOSIS — X58XXXA Exposure to other specified factors, initial encounter: Secondary | ICD-10-CM | POA: Diagnosis not present

## 2023-06-22 DIAGNOSIS — S86911A Strain of unspecified muscle(s) and tendon(s) at lower leg level, right leg, initial encounter: Secondary | ICD-10-CM

## 2023-06-22 MED ORDER — CYCLOBENZAPRINE HCL 10 MG PO TABS
10.0000 mg | ORAL_TABLET | Freq: Once | ORAL | Status: AC
  Administered 2023-06-22: 10 mg via ORAL
  Filled 2023-06-22: qty 1

## 2023-06-22 MED ORDER — IBUPROFEN 800 MG PO TABS: 800.0000 mg | ORAL_TABLET | Freq: Three times a day (TID) | ORAL | 0 refills | Status: DC | PRN

## 2023-06-22 MED ORDER — CYCLOBENZAPRINE HCL 5 MG PO TABS: 5.0000 mg | ORAL_TABLET | Freq: Three times a day (TID) | ORAL | 0 refills | Status: DC | PRN

## 2023-06-22 NOTE — ED Triage Notes (Signed)
Pt presents via POV c/o right knee pain. Reports feels a "lump" on it. Unknown if hit it on something. Reports noticed lump x1 week ago and pain x1 month.

## 2023-06-22 NOTE — ED Provider Notes (Signed)
[facility] Emergency Department Provider Note     Event Date/Time   First MD Initiated Contact with Patient 06/22/23 1929     (approximate)   History   Knee Pain   HPI  Melissa Dixon is a 37 y.o. female with a noncontributory medical history, presents to the ED for evaluation of some left knee pain.  She also reports some soft tissue swelling denies any recent injury, trauma, or fall.  She also denies any history of chronic ongoing knee pain.     Physical Exam   Triage Vital Signs: ED Triage Vitals  Encounter Vitals Group     BP 06/22/23 1922 (!) 140/98     Systolic BP Percentile --      Diastolic BP Percentile --      Pulse Rate 06/22/23 1922 74     Resp 06/22/23 1922 18     Temp 06/22/23 1922 98.4 F (36.9 C)     Temp Source 06/22/23 1922 Oral     SpO2 06/22/23 1922 100 %     Weight 06/22/23 1922 260 lb (117.9 kg)     Height 06/22/23 1922 5\' 7"  (1.702 m)     Head Circumference --      Peak Flow --      Pain Score 06/22/23 1922 9     Pain Loc --      Pain Education --      Exclude from Growth Chart --     Most recent vital signs: Vitals:   06/22/23 1922  BP: (!) 140/98  Pulse: 74  Resp: 18  Temp: 98.4 F (36.9 C)  SpO2: 100%    General Awake, no distress. NAD HEENT NCAT. PERRL. EOMI. No rhinorrhea. Mucous membranes are moist.  CV:  Good peripheral perfusion.  RESP:  Normal effort.  ABD:  No distention.  MSK:  Right knee with obvious deformity, dislocation, or sulcus sign.  Patient was a subtle soft tissue swelling to the medial tibial plateau, likely representing a subcutaneous hematoma or lipoma.  No significant valgus or varus joint stress is elicited.  Normal flexion extension range on exam.  No patella laxity noted.  No popliteal space fullness of her calf/Achilles tenderness noticed distally.   ED Results / Procedures / Treatments   Labs (all labs ordered are listed, but only abnormal results are displayed) Labs  Reviewed - No data to display   EKG   RADIOLOGY  I personally viewed and evaluated these images as part of my medical decision making, as well as reviewing the written report by the radiologist.  ED Provider Interpretation: No acute findings  DG Knee Right Port  Result Date: 06/22/2023 CLINICAL DATA:  Right knee pain. EXAM: PORTABLE RIGHT KNEE - 1-2 VIEW COMPARISON:  None Available. FINDINGS: There is no acute fracture or dislocation. The bones are well mineralized. There is mild arthritic changes with spurring. No joint effusion. The soft tissues are unremarkable. IMPRESSION: 1. No acute fracture or dislocation. 2. Mild arthritic changes. Electronically Signed   By: Elgie Collard M.D.   On: 06/22/2023 21:50     PROCEDURES:  Critical Care performed: No  Procedures   MEDICATIONS ORDERED IN ED: Medications  cyclobenzaprine (FLEXERIL) tablet 10 mg (10 mg Oral Given 06/22/23 2016)     IMPRESSION / MDM / ASSESSMENT AND PLAN / ED COURSE  I reviewed the triage vital signs and the nursing notes.  Differential diagnosis includes, but is not limited to, knee sprain, effusion, hematoma, lipoma  Patient's presentation is most consistent with acute complicated illness / injury requiring diagnostic workup.  Patient's diagnosis is consistent with knee sprain. No evidence of internal derangement. Patient will be discharged home with prescriptions for Flexeril and IBU. Patient is to follow up with ortho, as discussed, as needed or otherwise directed. Patient is given ED precautions to return to the ED for any worsening or new symptoms.     FINAL CLINICAL IMPRESSION(S) / ED DIAGNOSES   Final diagnoses:  Acute pain of right knee  Strain of right knee, initial encounter     Rx / DC Orders   ED Discharge Orders          Ordered    cyclobenzaprine (FLEXERIL) 5 MG tablet  3 times daily PRN        06/22/23 2011    ibuprofen (ADVIL) 800 MG tablet  Every 8  hours PRN        06/22/23 2011             Note:  This document was prepared using Dragon voice recognition software and may include unintentional dictation errors.    Lissa Hoard, PA-C 06/22/23 2328    Corena Herter, MD 06/22/23 2342

## 2023-06-22 NOTE — Discharge Instructions (Signed)
Your exam and x-ray are normal and reassuring at this time.  No signs of a serious underlying bony injury.  Symptoms are consistent with possible knee sprain.  You may also have a hematoma or lipoma at the medial joint at the area of concern.  Take the prescription meds as directed.  Wear the knee sleeve for support.  Follow-up with orthopedics as discussed.

## 2023-06-22 NOTE — ED Notes (Signed)
Pt verbalizes understanding of discharge instructions. Opportunity for questioning and answers were provided. Pt discharged from ED to home with husband.    

## 2023-09-01 ENCOUNTER — Other Ambulatory Visit: Payer: Self-pay | Admitting: Sports Medicine

## 2023-09-01 DIAGNOSIS — S6992XS Unspecified injury of left wrist, hand and finger(s), sequela: Secondary | ICD-10-CM

## 2023-09-01 DIAGNOSIS — M25542 Pain in joints of left hand: Secondary | ICD-10-CM

## 2023-09-01 DIAGNOSIS — S63629A Sprain of interphalangeal joint of unspecified thumb, initial encounter: Secondary | ICD-10-CM

## 2023-09-16 ENCOUNTER — Other Ambulatory Visit: Payer: Self-pay | Admitting: Sports Medicine

## 2023-09-16 DIAGNOSIS — M67461 Ganglion, right knee: Secondary | ICD-10-CM

## 2023-09-17 ENCOUNTER — Encounter: Payer: Self-pay | Admitting: Sports Medicine

## 2023-09-19 ENCOUNTER — Inpatient Hospital Stay
Admission: RE | Admit: 2023-09-19 | Discharge: 2023-09-19 | Discharge status: Home / Self Care | Payer: 59 | Source: Ambulatory Visit | Attending: Sports Medicine

## 2023-09-19 ENCOUNTER — Ambulatory Visit
Admission: RE | Admit: 2023-09-19 | Discharge: 2023-09-19 | Disposition: A | Discharge status: Home / Self Care | Payer: 59 | Source: Ambulatory Visit | Attending: Sports Medicine | Admitting: Sports Medicine

## 2023-09-19 DIAGNOSIS — S6992XS Unspecified injury of left wrist, hand and finger(s), sequela: Secondary | ICD-10-CM

## 2023-09-19 DIAGNOSIS — S63629A Sprain of interphalangeal joint of unspecified thumb, initial encounter: Secondary | ICD-10-CM

## 2023-09-19 DIAGNOSIS — M25542 Pain in joints of left hand: Secondary | ICD-10-CM

## 2023-09-19 DIAGNOSIS — M67461 Ganglion, right knee: Secondary | ICD-10-CM

## 2023-10-18 ENCOUNTER — Other Ambulatory Visit: Payer: Self-pay

## 2023-10-18 ENCOUNTER — Emergency Department
Admission: EM | Admit: 2023-10-18 | Discharge: 2023-10-18 | Disposition: A | Discharge status: Home / Self Care | Payer: 59 | Attending: Emergency Medicine | Admitting: Emergency Medicine

## 2023-10-18 DIAGNOSIS — M6740 Ganglion, unspecified site: Secondary | ICD-10-CM

## 2023-10-18 DIAGNOSIS — M67461 Ganglion, right knee: Secondary | ICD-10-CM | POA: Insufficient documentation

## 2023-10-18 DIAGNOSIS — M25561 Pain in right knee: Secondary | ICD-10-CM | POA: Diagnosis present

## 2023-10-18 MED ORDER — LIDOCAINE HCL (PF) 1 % IJ SOLN
5.0000 mL | Freq: Once | INTRAMUSCULAR | Status: AC
  Administered 2023-10-18: 5 mL via INTRADERMAL
  Filled 2023-10-18: qty 5

## 2023-10-18 MED ORDER — TRAMADOL HCL 50 MG PO TABS
50.0000 mg | ORAL_TABLET | Freq: Once | ORAL | Status: AC
  Administered 2023-10-18: 50 mg via ORAL
  Filled 2023-10-18: qty 1

## 2023-10-18 MED ORDER — KETOROLAC TROMETHAMINE 10 MG PO TABS
10.0000 mg | ORAL_TABLET | Freq: Once | ORAL | Status: AC
  Administered 2023-10-18: 10 mg via ORAL
  Filled 2023-10-18: qty 1

## 2023-10-18 NOTE — ED Provider Notes (Signed)
 [facility] Provider Note    Event Date/Time   First MD Initiated Contact with Patient 10/18/23 1559     (approximate)   History   Knee Pain   HPI  Melissa Dixon is a 38 y.o. female who comes ED complaining of right knee pain that has been gradual onset and worsening over the past several weeks.  She has recently seen orthopedics, had MRI of the right knee which demonstrates a ganglion cyst overlying the MCL at the level of the tibial plateau as well as a complex tear of the right knee lateral meniscus.  She is due to follow-up in orthopedics clinic in 4 days for further planning.  Reports that she is having mechanical symptoms of the knee with locking.  Denies any fever chest pain or shortness of breath, no swelling other than localized over the left inner knee.  Just comes for symptom relief due to worsening pain.     Physical Exam   Triage Vital Signs: ED Triage Vitals  Encounter Vitals Group     BP 10/18/23 1442 136/71     Systolic BP Percentile --      Diastolic BP Percentile --      Pulse Rate 10/18/23 1442 85     Resp 10/18/23 1442 18     Temp 10/18/23 1442 97.7 F (36.5 C)     Temp src --      SpO2 10/18/23 1442 99 %     Weight 10/18/23 1441 250 lb (113.4 kg)     Height 10/18/23 1441 5' 6 (1.676 m)     Head Circumference --      Peak Flow --      Pain Score 10/18/23 1441 10     Pain Loc --      Pain Education --      Exclude from Growth Chart --     Most recent vital signs: Vitals:   10/18/23 1442  BP: 136/71  Pulse: 85  Resp: 18  Temp: 97.7 F (36.5 C)  SpO2: 99%    General: Awake, no distress.  CV:  Good peripheral perfusion.  Normal distal pulses. Resp:  Normal effort.  Abd:  No distention.  Other:  Palpable swelling over the medial aspect of the tibial plateau on the right leg.  Bedside ultrasound demonstrates a septated cyst about 1 cm deep to the skin surface.  Color Doppler used to confirm no overlying vascular  structures.   ED Results / Procedures / Treatments   Labs (all labs ordered are listed, but only abnormal results are displayed) Labs Reviewed - No data to display   RADIOLOGY    PROCEDURES:  .Ultrasound ED Soft Tissue  Date/Time: 10/18/2023 4:55 PM  Performed by: Viviann Pastor, MD Authorized by: Viviann Pastor, MD   Procedure details:    Indications: localization of abscess     Transverse view:  Visualized   Longitudinal view:  Visualized   Images: not archived   Location:    Location: lower extremity     Side:  Right Findings:     no cellulitis present    no foreign body present Comments:     Septated Ganglion cyst identified and aspirated under US  visualization   Aspiration of blood/fluid  Date/Time: 10/18/2023 4:56 PM  Performed by: Viviann Pastor, MD Authorized by: Viviann Pastor, MD  Consent: Verbal consent obtained. Risks and benefits: risks, benefits and alternatives were discussed Consent given by: patient Patient understanding: patient states understanding of  the procedure being performed Required items: required blood products, implants, devices, and special equipment available Patient identity confirmed: verbally with patient Time out: Immediately prior to procedure a time out was called to verify the correct patient, procedure, equipment, support staff and site/side marked as required. Preparation: Patient was prepped and draped in the usual sterile fashion. Local anesthesia used: yes Anesthesia: local infiltration  Anesthesia: Local anesthesia used: yes Local Anesthetic: lidocaine  1% without epinephrine  Anesthetic total: 2 mL  Sedation: Patient sedated: no  Patient tolerance: patient tolerated the procedure well with no immediate complications Comments: Right knee medial ganglion cyst aspirated with collapse of both portions of septated cavity. Total 8 mL of gelatinous clear fluid removed.      MEDICATIONS ORDERED IN  ED: Medications  lidocaine  (PF) (XYLOCAINE ) 1 % injection 5 mL (5 mLs Intradermal Given by Other 10/18/23 1628)  traMADol  (ULTRAM ) tablet 50 mg (50 mg Oral Given 10/18/23 1628)  ketorolac  (TORADOL ) tablet 10 mg (10 mg Oral Given 10/18/23 1658)     IMPRESSION / MDM / ASSESSMENT AND PLAN / ED COURSE  I reviewed the triage vital signs and the nursing notes.                             Patient presents with gradually worsening right knee pain centered around a previously identified ganglion cyst from outpatient MRI.  This was aspirated under continuous ultrasound visualization in the ED in a longitudinal plane with good result, no complication.       FINAL CLINICAL IMPRESSION(S) / ED DIAGNOSES   Final diagnoses:  Ganglion cyst     Rx / DC Orders   ED Discharge Orders     None        Note:  This document was prepared using Dragon voice recognition software and may include unintentional dictation errors.   Viviann Pastor, MD 10/18/23 1700

## 2023-10-18 NOTE — Discharge Instructions (Addendum)
 Continue taking anti-inflammatory medication such as naproxen 500mg  two times per day until you see Dr. Joice Lofts.

## 2023-10-18 NOTE — ED Triage Notes (Signed)
 Pt comes with c/o right knee pain. Pt states she had MRI done and dx with arthritis and ganglion cyst. Pt stats she has appt with [facility] but the pain is so severe.

## 2023-10-18 NOTE — ED Notes (Signed)
 See triage notes. Patient c/o right knee pain. Patient has a ganglion cyst on the right knee.

## 2023-11-24 ENCOUNTER — Other Ambulatory Visit: Payer: Self-pay | Admitting: Surgery

## 2023-11-25 ENCOUNTER — Other Ambulatory Visit: Payer: Self-pay

## 2023-11-25 ENCOUNTER — Encounter
Admission: RE | Admit: 2023-11-25 | Discharge: 2023-11-25 | Disposition: A | Discharge status: Home / Self Care | Payer: 59 | Source: Ambulatory Visit | Attending: Surgery | Admitting: Surgery

## 2023-11-25 VITALS — Ht 67.0 in | Wt 265.0 lb

## 2023-11-25 DIAGNOSIS — Z01812 Encounter for preprocedural laboratory examination: Secondary | ICD-10-CM

## 2023-11-25 HISTORY — DX: Other specified joint disorders, right knee: M25.861

## 2023-11-25 HISTORY — DX: Unspecified injury of left wrist, hand and finger(s), initial encounter: S69.92XA

## 2023-11-25 HISTORY — DX: Sprain of interphalangeal joint of unspecified thumb, initial encounter: S63.629A

## 2023-11-25 HISTORY — DX: Complex tear of lateral meniscus, current injury, right knee, initial encounter: S83.271A

## 2023-11-25 HISTORY — DX: Pain in joints of left hand: M25.542

## 2023-11-25 HISTORY — DX: Unilateral primary osteoarthritis, right knee: M17.11

## 2023-11-25 NOTE — Patient Instructions (Addendum)
Your procedure is scheduled on: Wednesday 12/02/23 Report to the Registration Desk on the 1st floor of the [facility]. To find out your arrival time, please call [phone] between 1PM - 3PM on: Tuesday 12/01/23 If your arrival time is 6:00 am, do not arrive before that time as the [facility] entrance doors do not open until 6:00 am.  REMEMBER: Instructions that are not followed completely may result in serious medical risk, up to and including death; or upon the discretion of your surgeon and anesthesiologist your surgery may need to be rescheduled.  Do not eat food after midnight the night before surgery.  No gum chewing or hard candies.  You may however, drink CLEAR liquids up to 2 hours before you are scheduled to arrive for your surgery. Do not drink anything within 2 hours of your scheduled arrival time.  Clear liquids include: - water  - apple juice without pulp - gatorade (not RED colors) - black coffee or tea (Do NOT add milk or creamers to the coffee or tea) Do NOT drink anything that is not on this list.  In addition, your doctor has ordered for you to drink the provided:  Ensure Pre-Surgery Clear Carbohydrate Drink  Drinking this carbohydrate drink up to two hours before surgery helps to reduce insulin resistance and improve patient outcomes. Please complete drinking 2 hours before scheduled arrival time.  One week prior to surgery: Stop Anti-inflammatories (NSAIDS) such as Advil, Aleve, Ibuprofen, Motrin, Naproxen, Naprosyn and Aspirin based products such as Excedrin, Goody's Powder, BC Powder. Stop ANY OVER THE COUNTER supplements until after surgery.  You may however, continue to take Tylenol if needed for pain up until the day of surgery.  Continue taking all of your other prescription medications up until the day of surgery.  ON THE DAY OF SURGERY ONLY TAKE THESE MEDICATIONS WITH SIPS OF WATER:  None    No Alcohol for 24 hours before or after surgery.  No  Smoking including e-cigarettes for 24 hours before surgery.  No chewable tobacco products for at least 6 hours before surgery.  No nicotine patches on the day of surgery.  Do not use any "recreational" drugs for at least a week (preferably 2 weeks) before your surgery.  Please be advised that the combination of cocaine and anesthesia may have negative outcomes, up to and including death. If you test positive for cocaine, your surgery will be cancelled.  On the morning of surgery brush your teeth with toothpaste and water, you may rinse your mouth with mouthwash if you wish. Do not swallow any toothpaste or mouthwash.  Use CHG Soap or wipes as directed on instruction sheet.  Do not wear jewelry, make-up, hairpins, clips or nail polish.  For welded (permanent) jewelry: bracelets, anklets, waist bands, etc.  Please have this removed prior to surgery.  If it is not removed, there is a chance that hospital personnel will need to cut it off on the day of surgery.  Do not wear lotions, powders, or perfumes.   Do not shave body hair from the neck down 48 hours before surgery.  Contact lenses, hearing aids and dentures may not be worn into surgery.  Do not bring valuables to the hospital. [facility] is not responsible for any missing/lost belongings or valuables.   Notify your doctor if there is any change in your medical condition (cold, fever, infection).  Wear comfortable clothing (specific to your surgery type) to the hospital.  After surgery, you can  help prevent lung complications by doing breathing exercises.  Take deep breaths and cough every 1-2 hours. Your doctor may order a device called an Incentive Spirometer to help you take deep breaths. When coughing or sneezing, hold a pillow firmly against your incision with both hands. This is called "splinting." Doing this helps protect your incision. It also decreases belly discomfort.  If you are being admitted to the hospital  overnight, leave your suitcase in the car. After surgery it may be brought to your room.  In case of increased patient census, it may be necessary for you, the patient, to continue your postoperative care in the Same Day Surgery department.  If you are being discharged the day of surgery, you will not be allowed to drive home. You will need a responsible individual to drive you home and stay with you for 24 hours after surgery.   If you are taking public transportation, you will need to have a responsible individual with you.  Please call the Pre-admissions Testing Dept. at [phone] if you have any questions about these instructions.  Surgery Visitation Policy:  Patients having surgery or a procedure may have two visitors.  Children under the age of 62 must have an adult with them who is not the patient.  Temporary Visitor Restrictions Due to increasing cases of flu, RSV and COVID-19: Children ages 102 and under will not be able to visit patients in [facility] hospitals under most circumstances.  Inpatient Visitation:    Visiting hours are 7 a.m. to 8 p.m. Up to four visitors are allowed at one time in a patient room. The visitors may rotate out with other people during the day.  One visitor age 15 or older may stay with the patient overnight and must be in the room by 8 p.m.     Preparing for Surgery with CHLORHEXIDINE GLUCONATE (CHG) Soap  Chlorhexidine Gluconate (CHG) Soap  o An antiseptic cleaner that kills germs and bonds with the skin to continue killing germs even after washing  o Used for showering the night before surgery and morning of surgery  Before surgery, you can play an important role by reducing the number of germs on your skin.  CHG (Chlorhexidine gluconate) soap is an antiseptic cleanser which kills germs and bonds with the skin to continue killing germs even after washing.  Please do not use if you have an allergy to CHG or antibacterial soaps. If  your skin becomes reddened/irritated stop using the CHG.  1. Shower the NIGHT BEFORE SURGERY and the MORNING OF SURGERY with CHG soap.  2. If you choose to wash your hair, wash your hair first as usual with your normal shampoo.  3. After shampooing, rinse your hair and body thoroughly to remove the shampoo.  4. Use CHG as you would any other liquid soap. You can apply CHG directly to the skin and wash gently with a scrungie or a clean washcloth.  5. Apply the CHG soap to your body only from the neck down. Do not use on open wounds or open sores. Avoid contact with your eyes, ears, mouth, and genitals (private parts). Wash face and genitals (private parts) with your normal soap.  6. Wash thoroughly, paying special attention to the area where your surgery will be performed.  7. Thoroughly rinse your body with warm water.  8. Do not shower/wash with your normal soap after using and rinsing off the CHG soap.  9. Pat yourself dry with a clean  towel.  10. Wear clean pajamas to bed the night before surgery.  12. Place clean sheets on your bed the night of your first shower and do not sleep with pets.  13. Shower again with the CHG soap on the day of surgery prior to arriving at the hospital.  14. Do not apply any deodorants/lotions/powders.  15. Please wear clean clothes to the hospital.  How to Use an Incentive Spirometer  An incentive spirometer is a tool that measures how well you are filling your lungs with each breath. Learning to take long, deep breaths using this tool can help you keep your lungs clear and active. This may help to reverse or lessen your chance of developing breathing (pulmonary) problems, especially infection. You may be asked to use a spirometer: After a surgery. If you have a lung problem or a history of smoking. After a long period of time when you have been unable to move or be active. If the spirometer includes an indicator to show the highest number that you  have reached, your health care provider or respiratory therapist will help you set a goal. Keep a log of your progress as told by your health care provider. What are the risks? Breathing too quickly may cause dizziness or cause you to pass out. Take your time so you do not get dizzy or light-headed. If you are in pain, you may need to take pain medicine before doing incentive spirometry. It is harder to take a deep breath if you are having pain. How to use your incentive spirometer  Sit up on the edge of your bed or on a chair. Hold the incentive spirometer so that it is in an upright position. Before you use the spirometer, breathe out normally. Place the mouthpiece in your mouth. Make sure your lips are closed tightly around it. Breathe in slowly and as deeply as you can through your mouth, causing the piston or the ball to rise toward the top of the chamber. Hold your breath for 3-5 seconds, or for as long as possible. If the spirometer includes a coach indicator, use this to guide you in breathing. Slow down your breathing if the indicator goes above the marked areas. Remove the mouthpiece from your mouth and breathe out normally. The piston or ball will return to the bottom of the chamber. Rest for a few seconds, then repeat the steps 10 or more times. Take your time and take a few normal breaths between deep breaths so that you do not get dizzy or light-headed. Do this every 1-2 hours when you are awake. If the spirometer includes a goal marker to show the highest number you have reached (best effort), use this as a goal to work toward during each repetition. After each set of 10 deep breaths, cough a few times. This will help to make sure that your lungs are clear. If you have an incision on your chest or abdomen from surgery, place a pillow or a rolled-up towel firmly against the incision when you cough. This can help to reduce pain while taking deep breaths and coughing. General  tips When you are able to get out of bed: Walk around often. Continue to take deep breaths and cough in order to clear your lungs. Keep using the incentive spirometer until your health care provider says it is okay to stop using it. If you have been in the hospital, you may be told to keep using the spirometer at home. Contact a  health care provider if: You are having difficulty using the spirometer. You have trouble using the spirometer as often as instructed. Your pain medicine is not giving enough relief for you to use the spirometer as told. You have a fever. Get help right away if: You develop shortness of breath. You develop a cough with bloody mucus from the lungs. You have fluid or blood coming from an incision site after you cough. Summary An incentive spirometer is a tool that can help you learn to take long, deep breaths to keep your lungs clear and active. You may be asked to use a spirometer after a surgery, if you have a lung problem or a history of smoking, or if you have been inactive for a long period of time. Use your incentive spirometer as instructed every 1-2 hours while you are awake. If you have an incision on your chest or abdomen, place a pillow or a rolled-up towel firmly against your incision when you cough. This will help to reduce pain. Get help right away if you have shortness of breath, you cough up bloody mucus, or blood comes from your incision when you cough. This information is not intended to replace advice given to you by your health care provider. Make sure you discuss any questions you have with your health care provider. Document Revised: 12/19/2019 Document Reviewed: 12/19/2019 Elsevier Patient Education  2023 [employer].

## 2023-12-02 ENCOUNTER — Other Ambulatory Visit: Payer: Self-pay

## 2023-12-02 ENCOUNTER — Ambulatory Visit: Payer: Self-pay

## 2023-12-02 ENCOUNTER — Encounter
Admission: RE | Disposition: A | Discharge status: Home / Self Care | Payer: Self-pay | Source: Home / Self Care | Attending: Surgery

## 2023-12-02 ENCOUNTER — Ambulatory Visit
Admission: RE | Admit: 2023-12-02 | Discharge: 2023-12-02 | Disposition: A | Discharge status: Home / Self Care | Payer: 59 | Attending: Surgery | Admitting: Surgery

## 2023-12-02 ENCOUNTER — Encounter: Payer: Self-pay | Admitting: Surgery

## 2023-12-02 DIAGNOSIS — F17210 Nicotine dependence, cigarettes, uncomplicated: Secondary | ICD-10-CM | POA: Insufficient documentation

## 2023-12-02 DIAGNOSIS — M7138 Other bursal cyst, other site: Secondary | ICD-10-CM | POA: Insufficient documentation

## 2023-12-02 DIAGNOSIS — M1711 Unilateral primary osteoarthritis, right knee: Secondary | ICD-10-CM | POA: Diagnosis not present

## 2023-12-02 DIAGNOSIS — X58XXXA Exposure to other specified factors, initial encounter: Secondary | ICD-10-CM | POA: Insufficient documentation

## 2023-12-02 DIAGNOSIS — I10 Essential (primary) hypertension: Secondary | ICD-10-CM | POA: Insufficient documentation

## 2023-12-02 DIAGNOSIS — S83281A Other tear of lateral meniscus, current injury, right knee, initial encounter: Secondary | ICD-10-CM | POA: Insufficient documentation

## 2023-12-02 DIAGNOSIS — Z01812 Encounter for preprocedural laboratory examination: Secondary | ICD-10-CM

## 2023-12-02 HISTORY — PX: KNEE ARTHROSCOPY: SHX127

## 2023-12-02 HISTORY — DX: Other specified health status: Z78.9

## 2023-12-02 LAB — POCT PREGNANCY, URINE: Preg Test, Ur: NEGATIVE

## 2023-12-02 SURGERY — ARTHROSCOPY, KNEE
Anesthesia: General | Site: Knee | Laterality: Right

## 2023-12-02 MED ORDER — BUPIVACAINE LIPOSOME 1.3 % IJ SUSP
INTRAMUSCULAR | Status: DC | PRN
  Administered 2023-12-02: 20 mL

## 2023-12-02 MED ORDER — 0.9 % SODIUM CHLORIDE (POUR BTL) OPTIME
TOPICAL | Status: DC | PRN
  Administered 2023-12-02: 50 mL

## 2023-12-02 MED ORDER — OXYCODONE HCL 5 MG PO TABS
5.0000 mg | ORAL_TABLET | Freq: Once | ORAL | Status: AC | PRN
  Administered 2023-12-02: 5 mg via ORAL

## 2023-12-02 MED ORDER — FENTANYL CITRATE (PF) 100 MCG/2ML IJ SOLN
INTRAMUSCULAR | Status: AC
  Filled 2023-12-02: qty 2

## 2023-12-02 MED ORDER — LACTATED RINGERS IV SOLN: INTRAVENOUS | Status: DC

## 2023-12-02 MED ORDER — ACETAMINOPHEN 10 MG/ML IV SOLN
INTRAVENOUS | Status: DC | PRN
  Administered 2023-12-02: 1000 mg via INTRAVENOUS

## 2023-12-02 MED ORDER — FENTANYL CITRATE (PF) 100 MCG/2ML IJ SOLN
25.0000 ug | INTRAMUSCULAR | Status: DC | PRN
  Administered 2023-12-02 (×2): 25 ug via INTRAVENOUS

## 2023-12-02 MED ORDER — FENTANYL CITRATE (PF) 100 MCG/2ML IJ SOLN
INTRAMUSCULAR | Status: DC | PRN
  Administered 2023-12-02 (×3): 50 ug via INTRAVENOUS

## 2023-12-02 MED ORDER — CHLORHEXIDINE GLUCONATE 0.12 % MT SOLN
15.0000 mL | Freq: Once | OROMUCOSAL | Status: AC
  Administered 2023-12-02: 15 mL via OROMUCOSAL

## 2023-12-02 MED ORDER — MIDAZOLAM HCL 5 MG/5ML IJ SOLN
INTRAMUSCULAR | Status: DC | PRN
  Administered 2023-12-02: 2 mg via INTRAVENOUS

## 2023-12-02 MED ORDER — RINGERS IRRIGATION IR SOLN
Status: DC | PRN
  Administered 2023-12-02: 2000 mL

## 2023-12-02 MED ORDER — CEFAZOLIN SODIUM-DEXTROSE 2-4 GM/100ML-% IV SOLN
INTRAVENOUS | Status: AC
  Filled 2023-12-02: qty 100

## 2023-12-02 MED ORDER — LIDOCAINE HCL (PF) 1 % IJ SOLN
INTRAMUSCULAR | Status: AC
  Filled 2023-12-02: qty 30

## 2023-12-02 MED ORDER — KETAMINE HCL 50 MG/5ML IJ SOSY
PREFILLED_SYRINGE | INTRAMUSCULAR | Status: AC
  Filled 2023-12-02: qty 5

## 2023-12-02 MED ORDER — KETOROLAC TROMETHAMINE 30 MG/ML IJ SOLN
INTRAMUSCULAR | Status: AC
  Filled 2023-12-02: qty 1

## 2023-12-02 MED ORDER — OXYCODONE HCL 5 MG/5ML PO SOLN: 5.0000 mg | Freq: Once | ORAL | Status: AC | PRN

## 2023-12-02 MED ORDER — BUPIVACAINE-EPINEPHRINE (PF) 0.5% -1:200000 IJ SOLN
INTRAMUSCULAR | Status: AC
  Filled 2023-12-02: qty 60

## 2023-12-02 MED ORDER — BUPIVACAINE-EPINEPHRINE (PF) 0.5% -1:200000 IJ SOLN
INTRAMUSCULAR | Status: DC | PRN
  Administered 2023-12-02 (×2): 30 mL

## 2023-12-02 MED ORDER — FENTANYL CITRATE (PF) 100 MCG/2ML IJ SOLN
INTRAMUSCULAR | Status: AC
Start: 2023-12-02 — End: ?
  Filled 2023-12-02: qty 2

## 2023-12-02 MED ORDER — LIDOCAINE HCL 1 % IJ SOLN
INTRAMUSCULAR | Status: DC | PRN
  Administered 2023-12-02: 30 mL

## 2023-12-02 MED ORDER — SUCCINYLCHOLINE CHLORIDE 200 MG/10ML IV SOSY
PREFILLED_SYRINGE | INTRAVENOUS | Status: DC | PRN
  Administered 2023-12-02: 120 mg via INTRAVENOUS

## 2023-12-02 MED ORDER — DEXAMETHASONE SODIUM PHOSPHATE 10 MG/ML IJ SOLN
INTRAMUSCULAR | Status: DC | PRN
  Administered 2023-12-02: 10 mg via INTRAVENOUS

## 2023-12-02 MED ORDER — MIDAZOLAM HCL 2 MG/2ML IJ SOLN
INTRAMUSCULAR | Status: AC
  Filled 2023-12-02: qty 2

## 2023-12-02 MED ORDER — DEXMEDETOMIDINE HCL IN NACL 80 MCG/20ML IV SOLN
INTRAVENOUS | Status: DC | PRN
  Administered 2023-12-02: 8 ug via INTRAVENOUS
  Administered 2023-12-02: 12 ug via INTRAVENOUS

## 2023-12-02 MED ORDER — OXYCODONE HCL 5 MG PO TABS
ORAL_TABLET | ORAL | Status: AC
  Filled 2023-12-02: qty 1

## 2023-12-02 MED ORDER — CHLORHEXIDINE GLUCONATE 0.12 % MT SOLN
OROMUCOSAL | Status: AC
  Filled 2023-12-02: qty 15

## 2023-12-02 MED ORDER — HYDROCODONE-ACETAMINOPHEN 5-325 MG PO TABS: 1.0000 | ORAL_TABLET | Freq: Four times a day (QID) | ORAL | 0 refills | Status: DC | PRN

## 2023-12-02 MED ORDER — ONDANSETRON HCL 4 MG/2ML IJ SOLN
INTRAMUSCULAR | Status: DC | PRN
  Administered 2023-12-02: 4 mg via INTRAVENOUS

## 2023-12-02 MED ORDER — LIDOCAINE HCL (CARDIAC) PF 100 MG/5ML IV SOSY
PREFILLED_SYRINGE | INTRAVENOUS | Status: DC | PRN
  Administered 2023-12-02: 80 mg via INTRAVENOUS

## 2023-12-02 MED ORDER — CEFAZOLIN SODIUM-DEXTROSE 2-4 GM/100ML-% IV SOLN
2.0000 g | INTRAVENOUS | Status: AC
Start: 2023-12-02 — End: 2023-12-02
  Administered 2023-12-02: 3 g via INTRAVENOUS

## 2023-12-02 MED ORDER — ORAL CARE MOUTH RINSE: 15.0000 mL | Freq: Once | OROMUCOSAL | Status: AC

## 2023-12-02 MED ORDER — PROPOFOL 10 MG/ML IV BOLUS
INTRAVENOUS | Status: DC | PRN
  Administered 2023-12-02: 160 mg via INTRAVENOUS

## 2023-12-02 MED ORDER — KETOROLAC TROMETHAMINE 30 MG/ML IJ SOLN
30.0000 mg | Freq: Once | INTRAMUSCULAR | Status: AC
  Administered 2023-12-02: 30 mg via INTRAVENOUS

## 2023-12-02 MED ORDER — PROPOFOL 10 MG/ML IV BOLUS
INTRAVENOUS | Status: AC
  Filled 2023-12-02: qty 20

## 2023-12-02 SURGICAL SUPPLY — 39 items
BLADE FULL RADIUS 3.5 (BLADE) ×1 IMPLANT
BLADE SHAVER 4.5X7 STR FR (MISCELLANEOUS) ×1 IMPLANT
BNDG ELASTIC 6INX 5YD STR LF (GAUZE/BANDAGES/DRESSINGS) ×1 IMPLANT
BNDG ESMARCH 6X12 STRL LF (GAUZE/BANDAGES/DRESSINGS) ×1 IMPLANT
CATH ROBINSON RED A/P 12FR (CATHETERS) IMPLANT
CHLORAPREP W/TINT 26 (MISCELLANEOUS) ×1 IMPLANT
COLLECTOR GRAFT TISSUE (SYSTAGENIX WOUND MANAGEMENT) ×1
CUFF TRNQT CYL 24X4X16.5-23 (TOURNIQUET CUFF) IMPLANT
CUFF TRNQT CYL 34X4.125X (TOURNIQUET CUFF) IMPLANT
CUP MEDICINE 2OZ PLAST GRAD ST (MISCELLANEOUS) ×1 IMPLANT
DRAPE ARTHROSCOPY W/POUCH 90 (DRAPES) ×1 IMPLANT
DRAPE IMP U-DRAPE 54X76 (DRAPES) ×1 IMPLANT
ELECT REM PT RETURN 9FT ADLT (ELECTROSURGICAL) ×1
ELECTRODE REM PT RTRN 9FT ADLT (ELECTROSURGICAL) ×1 IMPLANT
GAUZE SPONGE 4X4 12PLY STRL (GAUZE/BANDAGES/DRESSINGS) ×1 IMPLANT
GLOVE BIO SURGEON STRL SZ8 (GLOVE) ×2 IMPLANT
GLOVE INDICATOR 8.0 STRL GRN (GLOVE) ×1 IMPLANT
GOWN STRL REUS W/ TWL LRG LVL3 (GOWN DISPOSABLE) ×1 IMPLANT
GOWN STRL REUS W/ TWL XL LVL3 (GOWN DISPOSABLE) ×2 IMPLANT
IV LR IRRIG 3000ML ARTHROMATIC (IV SOLUTION) ×1 IMPLANT
KIT TURNOVER KIT A (KITS) ×1 IMPLANT
MANIFOLD NEPTUNE II (INSTRUMENTS) ×2 IMPLANT
NDL HYPO 21X1.5 SAFETY (NEEDLE) ×1 IMPLANT
NEEDLE HYPO 21X1.5 SAFETY (NEEDLE) ×1
PACK ARTHROSCOPY KNEE (MISCELLANEOUS) ×1 IMPLANT
PENCIL SMOKE EVACUATOR (MISCELLANEOUS) IMPLANT
SLEEVE REMOTE CONTROL 5X12 (DRAPES) IMPLANT
SUT PROLENE 4 0 PS 2 18 (SUTURE) ×1 IMPLANT
SUT VIC AB 2-0 CT1 36 (SUTURE) IMPLANT
SUT VIC AB 2-0 CT1 TAPERPNT 27 (SUTURE) IMPLANT
SYR 20ML LL LF (SYRINGE) IMPLANT
SYR 30ML LL (SYRINGE) ×1 IMPLANT
SYR 50ML LL SCALE MARK (SYRINGE) ×1 IMPLANT
SYR TOOMEY 50ML (SYRINGE) IMPLANT
TISSUE GRAFT COLLECTOR (SYSTAGENIX WOUND MANAGEMENT) IMPLANT
TRAP FLUID SMOKE EVACUATOR (MISCELLANEOUS) ×1 IMPLANT
TUBE SET DOUBLEFLO INFLOW (TUBING) ×1 IMPLANT
WAND WEREWOLF FLOW 90D (MISCELLANEOUS) ×1 IMPLANT
WATER STERILE IRR 500ML POUR (IV SOLUTION) ×1 IMPLANT

## 2023-12-02 NOTE — Anesthesia Postprocedure Evaluation (Signed)
Anesthesia Post Note  Patient: Pharmacist, community  Procedure(s) Performed: RIGHT KNEE ARTHROSCOPY WITH DEBRIDEMENT AND PARTIAL LATERAL MENISCECTOMY AND AN OPEN EXCISION OF A CYST ON MEDIAL ASPECT OF RIGHT KNEE. (Right: Knee)  Patient location during evaluation: PACU Anesthesia Type: General Level of consciousness: awake and alert, oriented and patient cooperative Pain management: pain level controlled Vital Signs Assessment: post-procedure vital signs reviewed and stable Respiratory status: spontaneous breathing, nonlabored ventilation and respiratory function stable Cardiovascular status: blood pressure returned to baseline and stable Postop Assessment: adequate PO intake Anesthetic complications: no   No notable events documented.   Last Vitals:  Vitals:   12/02/23 1300 12/02/23 1317  BP: 109/65 100/66  Pulse: 73 67  Resp: 17 15  Temp:  (!) 36.3 C  SpO2: 96% 99%    Last Pain:  Vitals:   12/02/23 1317  TempSrc:   PainSc: 4                  Reed Breech

## 2023-12-02 NOTE — Anesthesia Procedure Notes (Signed)
Procedure Name: Intubation Date/Time: 12/02/2023 11:05 AM  Performed by: Cheral Bay, CRNAPre-anesthesia Checklist: Patient identified, Emergency Drugs available, Suction available and Patient being monitored Patient Re-evaluated:Patient Re-evaluated prior to induction Oxygen Delivery Method: Circle system utilized Preoxygenation: Pre-oxygenation with 100% oxygen Induction Type: IV induction Ventilation: Mask ventilation without difficulty Laryngoscope Size: McGrath and 3 Grade View: Grade I Tube type: Oral Tube size: 7.0 mm Number of attempts: 1 Airway Equipment and Method: Stylet Placement Confirmation: ETT inserted through vocal cords under direct vision, positive ETCO2 and breath sounds checked- equal and bilateral Secured at: 21 cm Tube secured with: Tape Dental Injury: Teeth and Oropharynx as per pre-operative assessment

## 2023-12-02 NOTE — H&P (Signed)
History of Present Illness:  Melissa Dixon is a 38 y.o. female who has been referred by Dr. Landry Mellow for right knee pain. The patient notes that her symptoms developed about 6 months ago but have worsened over the past month. Initially, she saw Dr. Regan Lemming for these symptoms 2 months ago. He identified and aspirated/injected an apparent ganglion cyst along the medial aspect of her knee just superficial to the medial collateral ligament which she states provided temporary partial relief of her symptoms. However, the fluid recurred. The patient was sent for an MRI scan to better evaluate the apparent cyst, and referred her to me for further evaluation and treatment. On today's visit, the patient rates her pain at 10/10. She localizes the pain to the medial more so than the lateral aspect of her knee. Her symptoms are worse with any standing or ambulation, as well as with any twisting type maneuvers. She also notes moderate "swelling" along the medial side of her knee. In fact, she went to the emergency room this past weekend and had some fluid "removed" from the knee by the ER provider under ultrasound guidance, but feels that the fluid already is beginning to recur. She denies any specific injury to the knee, and denies any numbness or paresthesias down her leg to her foot.  Current Outpatient Medications:  buPROPion (WELLBUTRIN XL) 150 MG XL tablet Take 150 mg by mouth once daily  clindamycin (CLEOCIN T) 1 % lotion Apply 1 % topically 2 (two) times daily  cyclobenzaprine (FLEXERIL) 5 MG tablet Take 5 mg by mouth 3 (three) times daily as needed  dextroamphetamine-amphetamine (ADDERALL) 20 mg tablet Take 20 mg by mouth every morning  fluconazole (DIFLUCAN) 100 MG tablet Take 100 mg by mouth once daily  FLUoxetine (PROZAC) 10 MG capsule Take 10 mg by mouth once daily  ibuprofen (MOTRIN) 600 MG tablet  ibuprofen (MOTRIN) 800 MG tablet Take 800 mg by mouth every 8 (eight) hours as needed  ketoconazole  (NIZORAL) 2 % shampoo Apply 2 % topically 3 (three) times a day  meloxicam (MOBIC) 15 MG tablet Take 1 tablet by mouth once daily  minoxidiL 5 % Foam Apply to scalp 1-2 times daily  traMADoL (ULTRAM) 50 mg tablet Take 50 mg by mouth every 6 (six) hours as needed for Pain   Allergies: No Known Allergies  Past Medical History: No past medical history on file.  Past Surgical History:  Toe surgery Left 09/1995 (Broke left pinky toe)   Family History:  Hypertension Mother  Diabetes Mother  Hyperlipidemia Mother  No Known Problems Father   Social History:   Socioeconomic History:  Marital status: Single  Tobacco Use  Smoking status: Every Day  Types: Cigarettes  Smokeless tobacco: Never  Vaping Use  Vaping status: Never Used  Substance and Sexual Activity  Alcohol use: Yes  Alcohol/week: 7.0 standard drinks of alcohol  Types: 7 Standard drinks or equivalent per week  Drug use: Never  Sexual activity: Defer   Review of Systems:  A comprehensive 14 point ROS was performed, reviewed, and the pertinent orthopaedic findings are documented in the HPI.  Physical Exam: Vitals:  10/23/23 0919 10/23/23 0920  BP: 130/78  Weight: (!) 125.5 kg (276 lb 9.6 oz)  Height: 170.2 cm (5\' 7" )  PainSc: 10-Worst pain ever 10-Worst pain ever  PainLoc: Finger Hand   General/Constitutional: Pleasant significantly overweight young female in no acute distress. Neuro/Psych: Normal mood and affect, oriented to person, place and time. Eyes: Non-icteric. Pupils  are equal, round, and reactive to light, and exhibit synchronous movement. ENT: Unremarkable. Lymphatic: No palpable adenopathy. Respiratory: Lungs clear to auscultation, Normal chest excursion, No wheezes, and Non-labored breathing Cardiovascular: Regular rate and rhythm. No murmurs. and No edema, swelling or tenderness, except as noted in detailed exam. Integumentary: No impressive skin lesions present, except as noted in detailed  exam. Musculoskeletal: Unremarkable, except as noted in detailed exam.  Right knee exam: The patient ambulates with a moderately antalgic gait, favoring her right leg, but is not using any assistive devices. Skin inspection of the right knee is notable for mild swelling, as well as more moderate focal swelling over the medial aspect of her knee in the area of the proximal tibia. A soft tissue fullness is noted in this area, consistent with a ganglion cyst. This area is moderately tender to palpation. She also notes mild tenderness to palpation along the lateral joint line. Actively, she is able to extend her knee fully and flex her knee beyond 100 degrees, although with some discomfort at the extremes of flexion and extension. She has an equivocally positive McMurray's sign. Her patella tracks well and is without crepitus. The knee is stable to varus and valgus stressing. She is grossly neurovascularly intact to the right lower extremity and foot.  X-rays/MRI/Lab data:  A recent MRI scan of the right knee is available for review and has been reviewed by myself. By report, the study demonstrates evidence of a subcutaneous ganglion cyst along the medial aspect of the knee, superficial to the MCL. There also is evidence of a complex tear of the lateral meniscus as well as mild tricompartmental degenerative changes. Both the films and report were reviewed by myself and discussed with the patient and her husband.  Assessment: 1. Complex tear of lateral meniscus of right knee.  2. Cyst of knee joint, right.  3. Primary osteoarthritis of right knee.   Plan: The treatment options were discussed with the patient and her husband. In addition, patient educational materials were provided regarding the diagnosis and treatment options. Although the patient is frustrated by both issues, she feels that the right knee pain is more symptomatic at this time and therefore would like to manage this first. Therefore, I  have recommended a surgical procedure, specifically a right knee arthroscopy with debridement and partial lateral meniscectomy, as well as an open excision of the ganglion cyst along the medial aspect of her right knee. The procedure was discussed with the patient, as were the potential risks (including bleeding, infection, nerve and/or blood vessel injury, persistent or recurrent pain, recurrence of the cyst, failure of the meniscal repair, progression of arthritis, need for further surgery, blood clots, strokes, heart attacks and/or arhythmias, pneumonia, etc.) and benefits. The patient states her understanding and wishes to proceed. All of the patient's questions and concerns were answered. She can call any time with further concerns. She will follow up post-surgery, routine.    H&P reviewed and patient re-examined. No changes.

## 2023-12-02 NOTE — Discharge Instructions (Addendum)
Orthopedic discharge instructions: Keep dressing dry and intact.  May shower after dressing changed on post-op day #4 (Sunday).  Cover staples/sutures with Band-Aids after drying off. Apply ice frequently to knee. Take Aleve 2 tabs BID with meals for 3-5 days, then as necessary. Take pain medication as prescribed or ES Tylenol when needed.  May weight-bear as tolerated - use crutches or walker as needed. Follow-up in 10-14 days or as scheduled.  Information for Discharge Teaching:  DO NOT REMOVE TEAL EXPAREL BRACELET FOR 4 Days, (96 hours), 12/06/2023 EXPAREL (bupivacaine liposome injectable suspension)   Pain relief is important to your recovery. The goal is to control your pain so you can move easier and return to your normal activities as soon as possible after your procedure. Your physician may use several types of medicines to manage pain, swelling, and more.  Your surgeon or anesthesiologist gave you EXPAREL(bupivacaine) to help control your pain after surgery.  EXPAREL is a local anesthetic designed to release slowly over an extended period of time to provide pain relief by numbing the tissue around the surgical site. EXPAREL is designed to release pain medication over time and can control pain for up to 72 hours. Depending on how you respond to EXPAREL, you may require less pain medication during your recovery. EXPAREL can help reduce or eliminate the need for opioids during the first few days after surgery when pain relief is needed the most. EXPAREL is not an opioid and is not addictive. It does not cause sleepiness or sedation.   Important! A teal colored band has been placed on your arm with the date, time and amount of EXPAREL you have received. Please leave this armband in place for the full 96 hours following administration, and then you may remove the band. If you return to the hospital for any reason within 96 hours following the administration of EXPAREL, the armband provides  important information that your health care providers to know, and alerts them that you have received this anesthetic.    Possible side effects of EXPAREL: Temporary loss of sensation or ability to move in the area where medication was injected. Nausea, vomiting, constipation Rarely, numbness and tingling in your mouth or lips, lightheadedness, or anxiety may occur. Call your doctor right away if you think you may be experiencing any of these sensations, or if you have other questions regarding possible side effects.  Follow all other discharge instructions given to you by your surgeon or nurse. Eat a healthy diet and drink plenty of water or other fluids.

## 2023-12-02 NOTE — Transfer of Care (Signed)
Immediate Anesthesia Transfer of Care Note  Patient: Pharmacist, community  Procedure(s) Performed: RIGHT KNEE ARTHROSCOPY WITH DEBRIDEMENT AND PARTIAL LATERAL MENISCECTOMY AND AN OPEN EXCISION OF A CYST ON MEDIAL ASPECT OF RIGHT KNEE. (Right: Knee)  Patient Location: PACU  Anesthesia Type:General  Level of Consciousness: awake  Airway & Oxygen Therapy: Patient Spontanous Breathing  Post-op Assessment: Report given to RN and Post -op Vital signs reviewed and stable  Post vital signs: Reviewed and stable  Last Vitals:  Vitals Value Taken Time  BP    Temp    Pulse 78 12/02/23 1250  Resp 12 12/02/23 1250  SpO2 97 % 12/02/23 1250  Vitals shown include unfiled device data.  Last Pain:  Vitals:   12/02/23 0934  TempSrc: Tympanic  PainSc: 0-No pain      Patients Stated Pain Goal: 0 (12/02/23 0934)  Complications: No notable events documented.

## 2023-12-02 NOTE — Anesthesia Preprocedure Evaluation (Signed)
Anesthesia Evaluation  Patient identified by MRN, date of birth, ID band Patient awake    Reviewed: Allergy & Precautions, NPO status , Patient's Chart, lab work & pertinent test results  History of Anesthesia Complications Negative for: history of anesthetic complications  Airway Mallampati: III  TM Distance: >3 FB Neck ROM: full    Dental  (+) Chipped, Poor Dentition   Pulmonary neg shortness of breath, Current Smoker and Patient abstained from smoking.   Pulmonary exam normal        Cardiovascular Exercise Tolerance: Good hypertension, Normal cardiovascular exam     Neuro/Psych  Neuromuscular disease  negative psych ROS   GI/Hepatic Neg liver ROS,GERD  Poorly Controlled,,  Endo/Other  negative endocrine ROS    Renal/GU      Musculoskeletal   Abdominal   Peds  Hematology negative hematology ROS (+)   Anesthesia Other Findings Past Medical History: No date: Anxiety No date: Complete tear of ulnar collateral ligament of  interphalangeal joint of thumb No date: Complex tear of lateral meniscus of right knee as current  injury, initial encounter No date: Cyst of knee joint, right No date: HTN (hypertension) No date: Injury of left thumb No date: Medical history non-contributory No date: Pain in joints of left hand No date: Primary osteoarthritis of right knee  Past Surgical History: No date: CYSTECTOMY     Comment:  right side No date: FRACTURE SURGERY; Left     Comment:  pikie toe at the age of 43  BMI    Body Mass Index: 41.50 kg/m      Reproductive/Obstetrics negative OB ROS                             Anesthesia Physical Anesthesia Plan  ASA: 3  Anesthesia Plan: General ETT   Post-op Pain Management:    Induction: Intravenous  PONV Risk Score and Plan: Ondansetron, Dexamethasone, Midazolam and Treatment may vary due to age or medical condition  Airway  Management Planned: Oral ETT  Additional Equipment:   Intra-op Plan:   Post-operative Plan: Extubation in OR  Informed Consent: I have reviewed the patients History and Physical, chart, labs and discussed the procedure including the risks, benefits and alternatives for the proposed anesthesia with the patient or authorized representative who has indicated his/her understanding and acceptance.     Dental Advisory Given  Plan Discussed with: Anesthesiologist, CRNA and Surgeon  Anesthesia Plan Comments: (Patient consented for risks of anesthesia including but not limited to:  - adverse reactions to medications - damage to eyes, teeth, lips or other oral mucosa - nerve damage due to positioning  - sore throat or hoarseness - Damage to heart, brain, nerves, lungs, other parts of body or loss of life  Patient voiced understanding and assent.)       Anesthesia Quick Evaluation

## 2023-12-02 NOTE — Op Note (Signed)
12/02/2023  12:48 PM  Patient:   Melissa Dixon  Pre-Op Diagnosis:   Complex lateral meniscus tear, degenerative joint disease, and benign subcutaneous cyst, right knee.  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  Surgeon:   Maryagnes Amos, MD  Anesthesia:   GET  Findings:   As above.  There was a complex tear involving the entire lateral meniscus, but the medial meniscus was in satisfactory condition, as were the anterior posterior cruciate ligaments.  There were grade 3 chondromalacial changes involving the lateral femoral trochlea and the lateral tibial plateau, and grade 2-3 chondromalacial changes involving the medial tibial plateau and central patellar ridge.  Complications:   None  EBL:   5 cc.  Total fluids:   300 cc of crystalloid.  Tourniquet time:   62 minutes at 300 mmHg  Drains:   None  Closure:   4-0 Prolene interrupted sutures for the portal sites, 3-0 Vicryl subcuticular sutures for the medial knee incision.  Brief clinical note:   The patient is a 38 year old female with a 7-8 month history of medial greater than lateral sided right knee pain. Her symptoms have persisted spite medications, activity modification, etc. Her history and examination were consistent with degenerative changes of the joint with a lateral meniscus tear, as well as a benign-appearing cyst involving the medial aspect of the knee which recurred despite two attempts at aspiration. All of these findings were confirmed by MRI scan. The patient presents at this time for arthroscopy, debridement, partial lateral meniscectomy, reinjection of harvested fat cells, and open excision of the benign cutaneous cyst along the medial aspect of her knee.  Procedure:   The patient was brought into the  operating room and lain in the supine position. After adequate general endotracheal intubation and anesthesia was obtained, a timeout was performed to verify the appropriate side. The patient's right knee was injected sterilely using a solution of 30 cc of 1% lidocaine and 30 cc of 0.5% Sensorcaine with epinephrine. The right lower extremity was prepped with ChloraPrep solution before being draped sterilely. Preoperative antibiotics were administered. The expected portal sites and expected incision site were injected with 0.5% Sensorcaine with epinephrine before the camera was placed in the anterolateral portal and instrumentation performed through the anteromedial portal.   The knee was sequentially examined beginning in the suprapatellar pouch, then progressing to the patellofemoral space, the medial gutter and compartment, the notch, and finally the lateral compartment and gutter. The findings were as described above. Abundant reactive synovial tissues anteriorly, medially, and laterally were debrided using the full-radius resector in order to improve visualization. These tissues were collected in the Arthrex graft net device and kept on the back table for reinsertion into the knee joint at the end of the case. The lateral meniscus was carefully probed with the findings as described above. The areas of tearing were debrided back to stable margin using a combination of the straight mini Maitri's, the side-biting baskets, and the full-radius resector.  Subsequent probing of the remaining rim demonstrated good stability. In addition, the area of unstable grade 3 chondromalacial changes involving the lateral portion of the femoral trochlea were debrided back to stable margins using the full-radius resector. The instruments were removed from the joint after suctioning the excess fluid.   Attention was then directed to the cyst along the medial  aspect of the knee. An approximately 4 cm incision was made  longitudinally directly over the cyst. The incision was carried down through the subcutaneous tissues to expose the cyst. The cyst was carefully dissected out circumferentially and removed. During its excision, straw-colored slightly blood-tinged gelatinous fluid was identified leaking from the cyst, confirming its identity. The underlying medial collateral ligament appeared to be intact. The wound was copiously irrigated thoroughly with sterile saline solution before the subcutaneous tissues were closed in 2 layers using 2-0 Vicryl interrupted sutures. The skin was closed using 3-0 Vicryl subcuticular sutures before benzoin and Steri-Strips were applied to the skin.  The lateral portal site was closed using 4-0 Prolene interrupted sutures.  Utilizing a short metal cannula, a red rubber catheter was placed through the medial portal and the harvested infrapatellar fat cells were reinjected into the joint using a Toomey syringe. The medial portal was then reapproximated using 4-0 Prolene interrupted sutures. A total of 20 cc of Exparel was injected into the medial incision site as well as into the joint to help with postoperative analgesia before a sterile compressive bulky dressing was applied to the knee. The patient was then awakened, extubated, and returned to the recovery room in satisfactory condition after tolerating the procedure well.

## 2023-12-03 ENCOUNTER — Encounter: Payer: Self-pay | Admitting: Surgery

## 2023-12-04 LAB — SURGICAL PATHOLOGY

## 2023-12-11 ENCOUNTER — Other Ambulatory Visit: Payer: Self-pay | Admitting: Student

## 2023-12-11 DIAGNOSIS — M25461 Effusion, right knee: Secondary | ICD-10-CM

## 2023-12-11 DIAGNOSIS — Z87828 Personal history of other (healed) physical injury and trauma: Secondary | ICD-10-CM

## 2023-12-11 DIAGNOSIS — W19XXXA Unspecified fall, initial encounter: Secondary | ICD-10-CM

## 2023-12-14 ENCOUNTER — Ambulatory Visit
Admission: RE | Admit: 2023-12-14 | Discharge: 2023-12-14 | Disposition: A | Discharge status: Home / Self Care | Source: Ambulatory Visit | Attending: Student | Admitting: Student

## 2023-12-14 DIAGNOSIS — M1711 Unilateral primary osteoarthritis, right knee: Secondary | ICD-10-CM | POA: Insufficient documentation

## 2023-12-14 DIAGNOSIS — W19XXXA Unspecified fall, initial encounter: Secondary | ICD-10-CM | POA: Diagnosis not present

## 2023-12-14 DIAGNOSIS — M25561 Pain in right knee: Secondary | ICD-10-CM | POA: Insufficient documentation

## 2023-12-14 DIAGNOSIS — R600 Localized edema: Secondary | ICD-10-CM | POA: Diagnosis not present

## 2023-12-14 DIAGNOSIS — Z87828 Personal history of other (healed) physical injury and trauma: Secondary | ICD-10-CM | POA: Insufficient documentation

## 2023-12-14 DIAGNOSIS — M25461 Effusion, right knee: Secondary | ICD-10-CM | POA: Insufficient documentation

## 2023-12-14 DIAGNOSIS — Z9889 Other specified postprocedural states: Secondary | ICD-10-CM | POA: Diagnosis present

## 2024-01-28 ENCOUNTER — Other Ambulatory Visit: Payer: Self-pay | Admitting: Surgery

## 2024-02-04 ENCOUNTER — Other Ambulatory Visit: Payer: Self-pay

## 2024-02-04 ENCOUNTER — Encounter
Admission: RE | Admit: 2024-02-04 | Discharge: 2024-02-04 | Disposition: A | Discharge status: Home / Self Care | Source: Ambulatory Visit | Attending: Surgery | Admitting: Surgery

## 2024-02-04 VITALS — Ht 67.0 in | Wt 277.0 lb

## 2024-02-04 DIAGNOSIS — Z01818 Encounter for other preprocedural examination: Secondary | ICD-10-CM

## 2024-02-04 NOTE — Patient Instructions (Addendum)
 Your procedure is scheduled on: Wednesday 02/10/24 To find out your arrival time, please call [phone] between 1PM - 3PM on:  Tuesday 02/09/24  Report to the Registration Desk on the 1st floor of the [facility]. FREE Valet parking is available.  If your arrival time is 6:00 am, do not arrive before that time as the [facility] entrance doors do not open until 6:00 am.  REMEMBER: Instructions that are not followed completely may result in serious medical risk, up to and including death; or upon the discretion of your surgeon and anesthesiologist your surgery may need to be rescheduled.  Do not eat food after midnight the night before surgery.  No gum chewing or hard candies.  You may however, drink CLEAR liquids up to 2 hours before you are scheduled to arrive for your surgery. Do not drink anything within 2 hours of your scheduled arrival time.  Clear liquids include: - water  - apple juice without pulp - gatorade (not RED colors) - black coffee or tea (Do NOT add milk or creamers to the coffee or tea) Do NOT drink anything that is not on this list.  Type 1 and Type 2 diabetics should only drink water. . One week prior to surgery: Stop Anti-inflammatories (NSAIDS) such as Advil , Aleve, Ibuprofen , Motrin , Naproxen, Naprosyn and Aspirin based products such as Excedrin, Goody's Powder, BC Powder. You may however, continue to take Tylenol  if needed for pain up until the day of surgery.  Stop ANY OVER THE COUNTER supplements and vitamins until after surgery.  TAKE ONLY THESE MEDICATIONS THE MORNING OF SURGERY WITH A SIP OF WATER:  none  No Alcohol for 24 hours before or after surgery.  No Smoking including e-cigarettes for 24 hours before surgery.  No chewable tobacco products for at least 6 hours before surgery.  No nicotine patches on the day of surgery.  Do not use any "recreational" drugs for at least a week (preferably 2 weeks) before your surgery.  Please be advised  that the combination of cocaine and anesthesia may have negative outcomes, up to and including death. If you test positive for cocaine, your surgery will be cancelled.  On the morning of surgery brush your teeth with toothpaste and water, you may rinse your mouth with mouthwash if you wish. Do not swallow any toothpaste or mouthwash.  Use CHG Soap or wipes as directed on instruction sheet.  Do not wear lotions, powders, or perfumes.   Do not shave body hair from the neck down 48 hours before surgery.  Wear comfortable clothing (specific to your surgery type) to the hospital.  Do not wear jewelry, make-up, hairpins, clips or nail polish.  For welded (permanent) jewelry: bracelets, anklets, waist bands, etc.  Please have this removed prior to surgery.  If it is not removed, there is a chance that hospital personnel will need to cut it off on the day of surgery. Contact lenses, hearing aids and dentures may not be worn into surgery.  Do not bring valuables to the hospital. [facility] is not responsible for any missing/lost belongings or valuables.   Notify your doctor if there is any change in your medical condition (cold, fever, infection).  If you are being discharged the day of surgery, you will not be allowed to drive home. You will need a responsible individual to drive you home and stay with you for 24 hours after surgery.   If you are taking public transportation, you will need to have  a responsible individual with you.  After surgery, you can help prevent lung complications by doing breathing exercises.  Take deep breaths and cough every 1-2 hours. Your doctor may order a device called an Incentive Spirometer to help you take deep breaths.  Surgery Visitation Policy:  Patients undergoing a surgery or procedure may have two family members or support persons with them as long as the person is not COVID-19 positive or experiencing its symptoms.   IPlease call the Pre-admissions  Testing Dept. at [phone] if you have any questions about these instructions.     Preparing for Surgery with CHLORHEXIDINE  GLUCONATE (CHG) Soap  Chlorhexidine  Gluconate (CHG) Soap  o An antiseptic cleaner that kills germs and bonds with the skin to continue killing germs even after washing  o Used for showering the night before surgery and morning of surgery  Before surgery, you can play an important role by reducing the number of germs on your skin.  CHG (Chlorhexidine  gluconate) soap is an antiseptic cleanser which kills germs and bonds with the skin to continue killing germs even after washing.  Please do not use if you have an allergy to CHG or antibacterial soaps. If your skin becomes reddened/irritated stop using the CHG.  1. Shower the NIGHT BEFORE SURGERY and the MORNING OF SURGERY with CHG soap.  2. If you choose to wash your hair, wash your hair first as usual with your normal shampoo.  3. After shampooing, rinse your hair and body thoroughly to remove the shampoo.  4. Use CHG as you would any other liquid soap. You can apply CHG directly to the skin and wash gently with a scrungie or a clean washcloth.  5. Apply the CHG soap to your body only from the neck down. Do not use on open wounds or open sores. Avoid contact with your eyes, ears, mouth, and genitals (private parts). Wash face and genitals (private parts) with your normal soap.  6. Wash thoroughly, paying special attention to the area where your surgery will be performed.  7. Thoroughly rinse your body with warm water.  8. Do not shower/wash with your normal soap after using and rinsing off the CHG soap.  9. Pat yourself dry with a clean towel.  10. Wear clean pajamas to bed the night before surgery.  12. Place clean sheets on your bed the night of your first shower and do not sleep with pets.  13. Shower again with the CHG soap on the day of surgery prior to arriving at the hospital.  14. Do not  apply any deodorants/lotions/powders.  15. Please wear clean clothes to the hospital.

## 2024-02-10 ENCOUNTER — Ambulatory Visit: Admission: RE | Admit: 2024-02-10 | Source: Home / Self Care | Admitting: Surgery

## 2024-02-10 ENCOUNTER — Encounter: Admission: RE | Payer: Self-pay | Source: Home / Self Care

## 2024-02-10 SURGERY — REPAIR, LIGAMENT
Anesthesia: Choice | Site: Thumb | Laterality: Left

## 2024-03-16 ENCOUNTER — Other Ambulatory Visit: Payer: Self-pay | Admitting: Surgery

## 2024-03-23 ENCOUNTER — Other Ambulatory Visit: Payer: Self-pay

## 2024-03-23 ENCOUNTER — Encounter
Admission: RE | Admit: 2024-03-23 | Discharge: 2024-03-23 | Disposition: A | Discharge status: Home / Self Care | Source: Ambulatory Visit | Attending: Surgery | Admitting: Surgery

## 2024-03-23 VITALS — Ht 67.0 in | Wt 284.0 lb

## 2024-03-23 DIAGNOSIS — Z01812 Encounter for preprocedural laboratory examination: Secondary | ICD-10-CM

## 2024-03-23 HISTORY — DX: Radiculopathy, cervical region: M54.12

## 2024-03-23 HISTORY — DX: Gestational (pregnancy-induced) hypertension without significant proteinuria, unspecified trimester: O13.9

## 2024-03-23 NOTE — Patient Instructions (Addendum)
 Your procedure is scheduled on: Wednesday, June 18 Report to the Registration Desk on the 1st floor of the [facility] To find out your arrival time, please call [phone] between 1PM - 3PM on: Tuesday, June 17 If your arrival time is 6:00 am, do not arrive before that time as the [facility] entrance doors do not open until 6:00 am.  REMEMBER: Instructions that are not followed completely may result in serious medical risk, up to and including death; or upon the discretion of your surgeon and anesthesiologist your surgery may need to be rescheduled.  Do not eat food after midnight the night before surgery.  No gum chewing or hard candies.  You may however, drink CLEAR liquids up to 2 hours before you are scheduled to arrive for your surgery. Do not drink anything within 2 hours of your scheduled arrival time.  Clear liquids include: - water  - apple juice without pulp - gatorade (not RED colors) - black coffee or tea (Do NOT add milk or creamers to the coffee or tea) Do NOT drink anything that is not on this list.  In addition, your doctor has ordered for you to drink the provided:  Ensure Pre-Surgery Clear Carbohydrate Drink  Drinking this carbohydrate drink up to two hours before surgery helps to reduce insulin resistance and improve patient outcomes. Please complete drinking 2 hours before scheduled arrival time.  One week prior to surgery: starting June 11 Stop Anti-inflammatories (NSAIDS) such as Advil , Aleve, Ibuprofen , Motrin , Naproxen, Naprosyn and Aspirin based products such as Excedrin, Goody's Powder, BC Powder. Stop ANY OVER THE COUNTER supplements until after surgery.  You may however, continue to take Tylenol  if needed for pain up until the day of surgery.  ON THE DAY OF SURGERY DO NOT TAKE ANY MEDICATIONS   No Alcohol for 24 hours before or after surgery.  No Smoking including e-cigarettes for 24 hours before surgery.  No chewable tobacco products for at  least 6 hours before surgery.  No nicotine patches on the day of surgery.  Do not use any recreational drugs for at least a week (preferably 2 weeks) before your surgery.  Please be advised that the combination of cocaine and anesthesia may have negative outcomes, up to and including death. If you test positive for cocaine, your surgery will be cancelled.  On the morning of surgery brush your teeth with toothpaste and water, you may rinse your mouth with mouthwash if you wish. Do not swallow any toothpaste or mouthwash.  Use CHG Soap as directed on instruction sheet.  Do not wear jewelry, make-up, hairpins, clips or nail polish.  For welded (permanent) jewelry: bracelets, anklets, waist bands, etc.  Please have this removed prior to surgery.  If it is not removed, there is a chance that hospital personnel will need to cut it off on the day of surgery.  Do not wear lotions, powders, or perfumes.   Do not shave body hair from the neck down 48 hours before surgery.  Contact lenses, hearing aids and dentures may not be worn into surgery.  Do not bring valuables to the hospital. [facility] is not responsible for any missing/lost belongings or valuables.   Notify your doctor if there is any change in your medical condition (cold, fever, infection).  Wear comfortable clothing (specific to your surgery type) to the hospital.  After surgery, you can help prevent lung complications by doing breathing exercises.  Take deep breaths and cough every 1-2 hours. Your  doctor may order a device called an Incentive Spirometer to help you take deep breaths.  If you are being discharged the day of surgery, you will not be allowed to drive home. You will need a responsible individual to drive you home and stay with you for 24 hours after surgery.   If you are taking public transportation, you will need to have a responsible individual with you.  Please call the Pre-admissions Testing Dept. at [phone] if you have any questions about these instructions.  Surgery Visitation Policy:  Patients having surgery or a procedure may have two visitors.  Children under the age of 51 must have an adult with them who is not the patient.     Preparing for Surgery with CHLORHEXIDINE  GLUCONATE (CHG) Soap  Chlorhexidine  Gluconate (CHG) Soap  o An antiseptic cleaner that kills germs and bonds with the skin to continue killing germs even after washing  o Used for showering the night before surgery and morning of surgery  Before surgery, you can play an important role by reducing the number of germs on your skin.  CHG (Chlorhexidine  gluconate) soap is an antiseptic cleanser which kills germs and bonds with the skin to continue killing germs even after washing.  Please do not use if you have an allergy to CHG or antibacterial soaps. If your skin becomes reddened/irritated stop using the CHG.  1. Shower the NIGHT BEFORE SURGERY and the MORNING OF SURGERY with CHG soap.  2. If you choose to wash your hair, wash your hair first as usual with your normal shampoo.  3. After shampooing, rinse your hair and body thoroughly to remove the shampoo.  4. Use CHG as you would any other liquid soap. You can apply CHG directly to the skin and wash gently with a scrungie or a clean washcloth.  5. Apply the CHG soap to your body only from the neck down. Do not use on open wounds or open sores. Avoid contact with your eyes, ears, mouth, and genitals (private parts). Wash face and genitals (private parts) with your normal soap.  6. Wash thoroughly, paying special attention to the area where your surgery will be performed.  7. Thoroughly rinse your body with warm water.  8. Do not shower/wash with your normal soap after using and rinsing off the CHG soap.  9. Pat yourself dry with a clean towel.  10. Wear clean pajamas to bed the night before surgery.  12. Place clean sheets on your bed the night of  your first shower and do not sleep with pets.  13. Shower again with the CHG soap on the day of surgery prior to arriving at the hospital.  14. Do not apply any deodorants/lotions/powders.  15. Please wear clean clothes to the hospital.

## 2024-03-28 ENCOUNTER — Encounter
Admission: RE | Admit: 2024-03-28 | Discharge: 2024-03-28 | Disposition: A | Discharge status: Home / Self Care | Source: Ambulatory Visit | Attending: Surgery | Admitting: Surgery

## 2024-03-28 DIAGNOSIS — Z01812 Encounter for preprocedural laboratory examination: Secondary | ICD-10-CM | POA: Insufficient documentation

## 2024-03-28 LAB — CBC
HCT: 38.5 % (ref 36.0–46.0)
Hemoglobin: 12.1 g/dL (ref 12.0–15.0)
MCH: 25.2 pg — ABNORMAL LOW (ref 26.0–34.0)
MCHC: 31.4 g/dL (ref 30.0–36.0)
MCV: 80 fL (ref 80.0–100.0)
Platelets: 271 10*3/uL (ref 150–400)
RBC: 4.81 MIL/uL (ref 3.87–5.11)
RDW: 15.9 % — ABNORMAL HIGH (ref 11.5–15.5)
WBC: 11.8 10*3/uL — ABNORMAL HIGH (ref 4.0–10.5)
nRBC: 0 % (ref 0.0–0.2)

## 2024-03-28 LAB — BASIC METABOLIC PANEL WITH GFR
Anion gap: 10 (ref 5–15)
BUN: 15 mg/dL (ref 6–20)
CO2: 23 mmol/L (ref 22–32)
Calcium: 9.3 mg/dL (ref 8.9–10.3)
Chloride: 107 mmol/L (ref 98–111)
Creatinine, Ser: 0.65 mg/dL (ref 0.44–1.00)
GFR, Estimated: >60 mL/min (ref >60)
Glucose, Bld: 116 mg/dL — ABNORMAL HIGH (ref 70–99)
Potassium: 3.6 mmol/L (ref 3.5–5.1)
Sodium: 140 mmol/L (ref 135–145)

## 2024-03-30 ENCOUNTER — Other Ambulatory Visit: Payer: Self-pay

## 2024-03-30 ENCOUNTER — Encounter
Admission: RE | Disposition: A | Discharge status: Home / Self Care | Payer: Self-pay | Source: Home / Self Care | Attending: Surgery

## 2024-03-30 ENCOUNTER — Ambulatory Visit
Admission: RE | Admit: 2024-03-30 | Discharge: 2024-03-30 | Disposition: A | Discharge status: Home / Self Care | Attending: Surgery | Admitting: Surgery

## 2024-03-30 ENCOUNTER — Encounter: Payer: Self-pay | Admitting: Surgery

## 2024-03-30 ENCOUNTER — Ambulatory Visit

## 2024-03-30 ENCOUNTER — Ambulatory Visit: Payer: Self-pay | Admitting: Urgent Care

## 2024-03-30 ENCOUNTER — Ambulatory Visit: Admitting: Anesthesiology

## 2024-03-30 DIAGNOSIS — S5332XA Traumatic rupture of left ulnar collateral ligament, initial encounter: Secondary | ICD-10-CM | POA: Diagnosis present

## 2024-03-30 DIAGNOSIS — E66813 Obesity, class 3: Secondary | ICD-10-CM | POA: Diagnosis not present

## 2024-03-30 DIAGNOSIS — F17210 Nicotine dependence, cigarettes, uncomplicated: Secondary | ICD-10-CM | POA: Insufficient documentation

## 2024-03-30 DIAGNOSIS — W19XXXA Unspecified fall, initial encounter: Secondary | ICD-10-CM | POA: Diagnosis not present

## 2024-03-30 DIAGNOSIS — Z6841 Body Mass Index (BMI) 40.0 and over, adult: Secondary | ICD-10-CM | POA: Insufficient documentation

## 2024-03-30 DIAGNOSIS — Z01812 Encounter for preprocedural laboratory examination: Secondary | ICD-10-CM

## 2024-03-30 HISTORY — PX: LIGAMENT REPAIR: SHX5444

## 2024-03-30 LAB — POCT PREGNANCY, URINE: Preg Test, Ur: NEGATIVE

## 2024-03-30 SURGERY — REPAIR, LIGAMENT
Anesthesia: General | Site: Thumb | Laterality: Left

## 2024-03-30 MED ORDER — OXYCODONE HCL 5 MG/5ML PO SOLN: 5.0000 mg | Freq: Once | ORAL | Status: AC | PRN

## 2024-03-30 MED ORDER — SODIUM CHLORIDE 0.9 % IV SOLN: INTRAVENOUS | Status: DC

## 2024-03-30 MED ORDER — FENTANYL CITRATE (PF) 100 MCG/2ML IJ SOLN
INTRAMUSCULAR | Status: AC
Start: 2024-03-30 — End: 2024-03-30
  Filled 2024-03-30: qty 2

## 2024-03-30 MED ORDER — ONDANSETRON HCL 4 MG/2ML IJ SOLN: 4.0000 mg | Freq: Once | INTRAMUSCULAR | Status: DC | PRN

## 2024-03-30 MED ORDER — BUPIVACAINE HCL (PF) 0.5 % IJ SOLN
INTRAMUSCULAR | Status: DC | PRN
  Administered 2024-03-30: 5 mL

## 2024-03-30 MED ORDER — CEFAZOLIN SODIUM-DEXTROSE 2-4 GM/100ML-% IV SOLN
INTRAVENOUS | Status: AC
  Filled 2024-03-30: qty 100

## 2024-03-30 MED ORDER — BUPIVACAINE HCL (PF) 0.5 % IJ SOLN
INTRAMUSCULAR | Status: AC
  Filled 2024-03-30: qty 30

## 2024-03-30 MED ORDER — ORAL CARE MOUTH RINSE
15.0000 mL | Freq: Once | OROMUCOSAL | Status: AC
Start: 2024-03-30 — End: 2024-03-30

## 2024-03-30 MED ORDER — METOCLOPRAMIDE HCL 10 MG PO TABS: 5.0000 mg | ORAL_TABLET | Freq: Three times a day (TID) | ORAL | Status: DC | PRN

## 2024-03-30 MED ORDER — PHENYLEPHRINE 80 MCG/ML (10ML) SYRINGE FOR IV PUSH (FOR BLOOD PRESSURE SUPPORT)
PREFILLED_SYRINGE | INTRAVENOUS | Status: DC | PRN
  Administered 2024-03-30: 160 ug via INTRAVENOUS
  Administered 2024-03-30: 80 ug via INTRAVENOUS
  Administered 2024-03-30 (×2): 160 ug via INTRAVENOUS

## 2024-03-30 MED ORDER — MIDAZOLAM HCL 2 MG/2ML IJ SOLN
INTRAMUSCULAR | Status: DC | PRN
  Administered 2024-03-30: 2 mg via INTRAVENOUS

## 2024-03-30 MED ORDER — KETOROLAC TROMETHAMINE 30 MG/ML IJ SOLN: 30.0000 mg | Freq: Once | INTRAMUSCULAR | Status: DC

## 2024-03-30 MED ORDER — FENTANYL CITRATE (PF) 100 MCG/2ML IJ SOLN
25.0000 ug | INTRAMUSCULAR | Status: DC | PRN
  Administered 2024-03-30 (×4): 25 ug via INTRAVENOUS

## 2024-03-30 MED ORDER — ONDANSETRON HCL 4 MG PO TABS
4.0000 mg | ORAL_TABLET | Freq: Four times a day (QID) | ORAL | Status: DC | PRN
Start: 2024-03-30 — End: 2024-03-30

## 2024-03-30 MED ORDER — OXYCODONE HCL 5 MG PO TABS
5.0000 mg | ORAL_TABLET | Freq: Once | ORAL | Status: AC | PRN
  Administered 2024-03-30: 5 mg via ORAL

## 2024-03-30 MED ORDER — ONDANSETRON HCL 4 MG/2ML IJ SOLN: 4.0000 mg | Freq: Four times a day (QID) | INTRAMUSCULAR | Status: DC | PRN

## 2024-03-30 MED ORDER — PROPOFOL 10 MG/ML IV BOLUS
INTRAVENOUS | Status: AC
  Filled 2024-03-30: qty 20

## 2024-03-30 MED ORDER — METOCLOPRAMIDE HCL 5 MG/ML IJ SOLN: 5.0000 mg | Freq: Three times a day (TID) | INTRAMUSCULAR | Status: DC | PRN

## 2024-03-30 MED ORDER — CEFAZOLIN SODIUM-DEXTROSE 2-4 GM/100ML-% IV SOLN
2.0000 g | INTRAVENOUS | Status: AC
  Administered 2024-03-30: 3 g via INTRAVENOUS

## 2024-03-30 MED ORDER — HYDROCODONE-ACETAMINOPHEN 5-325 MG PO TABS: 1.0000 | ORAL_TABLET | ORAL | Status: DC | PRN

## 2024-03-30 MED ORDER — FENTANYL CITRATE (PF) 100 MCG/2ML IJ SOLN
INTRAMUSCULAR | Status: DC | PRN
  Administered 2024-03-30 (×2): 50 ug via INTRAVENOUS

## 2024-03-30 MED ORDER — ONDANSETRON HCL 4 MG/2ML IJ SOLN
INTRAMUSCULAR | Status: DC | PRN
  Administered 2024-03-30: 4 mg via INTRAVENOUS

## 2024-03-30 MED ORDER — KETOROLAC TROMETHAMINE 30 MG/ML IJ SOLN
INTRAMUSCULAR | Status: AC
  Filled 2024-03-30: qty 1

## 2024-03-30 MED ORDER — ACETAMINOPHEN 10 MG/ML IV SOLN
INTRAVENOUS | Status: DC | PRN
  Administered 2024-03-30: 1000 mg via INTRAVENOUS

## 2024-03-30 MED ORDER — DEXAMETHASONE SODIUM PHOSPHATE 10 MG/ML IJ SOLN
INTRAMUSCULAR | Status: AC
  Filled 2024-03-30: qty 1

## 2024-03-30 MED ORDER — PHENYLEPHRINE 80 MCG/ML (10ML) SYRINGE FOR IV PUSH (FOR BLOOD PRESSURE SUPPORT)
PREFILLED_SYRINGE | INTRAVENOUS | Status: AC
  Filled 2024-03-30: qty 10

## 2024-03-30 MED ORDER — HYDROCODONE-ACETAMINOPHEN 5-325 MG PO TABS: 1.0000 | ORAL_TABLET | Freq: Four times a day (QID) | ORAL | 0 refills | Status: AC | PRN

## 2024-03-30 MED ORDER — ONDANSETRON HCL 4 MG/2ML IJ SOLN
INTRAMUSCULAR | Status: AC
  Filled 2024-03-30: qty 2

## 2024-03-30 MED ORDER — ACETAMINOPHEN 325 MG PO TABS: 325.0000 mg | ORAL_TABLET | Freq: Four times a day (QID) | ORAL | Status: DC | PRN

## 2024-03-30 MED ORDER — LIDOCAINE HCL (CARDIAC) PF 100 MG/5ML IV SOSY
PREFILLED_SYRINGE | INTRAVENOUS | Status: DC | PRN
  Administered 2024-03-30: 100 mg via INTRAVENOUS

## 2024-03-30 MED ORDER — ACETAMINOPHEN 10 MG/ML IV SOLN
INTRAVENOUS | Status: AC
  Filled 2024-03-30: qty 100

## 2024-03-30 MED ORDER — LIDOCAINE HCL (PF) 2 % IJ SOLN
INTRAMUSCULAR | Status: AC
  Filled 2024-03-30: qty 5

## 2024-03-30 MED ORDER — CEFAZOLIN SODIUM-DEXTROSE 1-4 GM/50ML-% IV SOLN
INTRAVENOUS | Status: AC
  Filled 2024-03-30: qty 50

## 2024-03-30 MED ORDER — ACETAMINOPHEN 10 MG/ML IV SOLN: 1000.0000 mg | Freq: Once | INTRAVENOUS | Status: DC | PRN

## 2024-03-30 MED ORDER — LACTATED RINGERS IV SOLN: INTRAVENOUS | Status: DC

## 2024-03-30 MED ORDER — CHLORHEXIDINE GLUCONATE 0.12 % MT SOLN
OROMUCOSAL | Status: AC
  Filled 2024-03-30: qty 15

## 2024-03-30 MED ORDER — FENTANYL CITRATE (PF) 100 MCG/2ML IJ SOLN
INTRAMUSCULAR | Status: AC
  Filled 2024-03-30: qty 2

## 2024-03-30 MED ORDER — MIDAZOLAM HCL 2 MG/2ML IJ SOLN
INTRAMUSCULAR | Status: AC
  Filled 2024-03-30: qty 2

## 2024-03-30 MED ORDER — PROPOFOL 10 MG/ML IV BOLUS
INTRAVENOUS | Status: DC | PRN
  Administered 2024-03-30: 200 mg via INTRAVENOUS

## 2024-03-30 MED ORDER — DEXAMETHASONE SODIUM PHOSPHATE 10 MG/ML IJ SOLN
INTRAMUSCULAR | Status: DC | PRN
  Administered 2024-03-30: 10 mg via INTRAVENOUS

## 2024-03-30 MED ORDER — CHLORHEXIDINE GLUCONATE 0.12 % MT SOLN
15.0000 mL | Freq: Once | OROMUCOSAL | Status: AC
  Administered 2024-03-30: 15 mL via OROMUCOSAL

## 2024-03-30 MED ORDER — KETOROLAC TROMETHAMINE 30 MG/ML IJ SOLN
INTRAMUSCULAR | Status: DC | PRN
  Administered 2024-03-30: 30 mg via INTRAVENOUS

## 2024-03-30 MED ORDER — OXYCODONE HCL 5 MG PO TABS
ORAL_TABLET | ORAL | Status: AC
  Filled 2024-03-30: qty 1

## 2024-03-30 MED ORDER — 0.9 % SODIUM CHLORIDE (POUR BTL) OPTIME
TOPICAL | Status: DC | PRN
  Administered 2024-03-30: 250 mL

## 2024-03-30 SURGICAL SUPPLY — 34 items
ANCHOR REPAIR HAND WRIST (Anchor) IMPLANT
BENZOIN TINCTURE PRP APPL 2/3 (GAUZE/BANDAGES/DRESSINGS) ×1 IMPLANT
BNDG COHESIVE 4X5 TAN STRL LF (GAUZE/BANDAGES/DRESSINGS) ×1 IMPLANT
BNDG ELASTIC 2X5.8 VLCR NS LF (GAUZE/BANDAGES/DRESSINGS) ×1 IMPLANT
BNDG ELASTIC 3INX 5YD STR LF (GAUZE/BANDAGES/DRESSINGS) IMPLANT
BNDG ESMARCH 4X12 STRL LF (GAUZE/BANDAGES/DRESSINGS) ×1 IMPLANT
CHLORAPREP W/TINT 26 (MISCELLANEOUS) ×2 IMPLANT
CORD BIP STRL DISP 12FT (MISCELLANEOUS) ×1 IMPLANT
CUFF TOURN SGL QUICK 18X4 (TOURNIQUET CUFF) IMPLANT
CUFF TRNQT CYL 24X4X16.5-23 (TOURNIQUET CUFF) IMPLANT
ELECTRODE REM PT RTRN 9FT ADLT (ELECTROSURGICAL) ×1 IMPLANT
FORCEPS JEWEL BIP 4-3/4 STR (INSTRUMENTS) ×1 IMPLANT
GAUZE SPONGE 4X4 12PLY STRL (GAUZE/BANDAGES/DRESSINGS) ×1 IMPLANT
GAUZE XEROFORM 1X8 LF (GAUZE/BANDAGES/DRESSINGS) ×1 IMPLANT
GLOVE INDICATOR 8.0 STRL GRN (GLOVE) ×1 IMPLANT
GOWN STRL REUS W/ TWL LRG LVL3 (GOWN DISPOSABLE) ×1 IMPLANT
GOWN STRL REUS W/ TWL XL LVL3 (GOWN DISPOSABLE) ×1 IMPLANT
KIT TURNOVER KIT A (KITS) ×1 IMPLANT
LOOP VESSEL MAXI 1X406 RED (MISCELLANEOUS) ×1 IMPLANT
MANIFOLD NEPTUNE II (INSTRUMENTS) ×1 IMPLANT
NS IRRIG 500ML POUR BTL (IV SOLUTION) ×1 IMPLANT
PACK EXTREMITY ARMC (MISCELLANEOUS) ×1 IMPLANT
PAD ABD DERMACEA PRESS 5X9 (GAUZE/BANDAGES/DRESSINGS) ×1 IMPLANT
PAD CAST 4YDX4 CTTN HI CHSV (CAST SUPPLIES) ×3 IMPLANT
PENCIL SMOKE EVACUATOR (MISCELLANEOUS) ×1 IMPLANT
SPLINT CAST 1 STEP 4X15 (MISCELLANEOUS) IMPLANT
STAPLER SKIN PROX 35W (STAPLE) IMPLANT
STOCKINETTE IMPERVIOUS 9X36 MD (GAUZE/BANDAGES/DRESSINGS) ×1 IMPLANT
SUT MERSILENE 4-0 WHT RB-1 (SUTURE) IMPLANT
SUT VIC AB 2-0 CT1 (SUTURE) ×1 IMPLANT
SUT VIC AB 2-0 CT1 36 (SUTURE) IMPLANT
SUT VIC AB 3-0 SH 27X BRD (SUTURE) IMPLANT
TRAP FLUID SMOKE EVACUATOR (MISCELLANEOUS) ×1 IMPLANT
WATER STERILE IRR 500ML POUR (IV SOLUTION) ×1 IMPLANT

## 2024-03-30 NOTE — H&P (Signed)
 History of Present Illness:  Melissa Dixon is a 38 y.o. female who presents for a history and physical for an upcoming left thumb MCP joint ulnar collateral ligament repair to be done by Dr. Daun Epstein on March 30, 2024. The patient saw Dr. Daun Epstein on January 15, 2024 in regards to her left thumb. 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 She continues to note moderate pain in this area especially with grasping or holding of objects. She is frustrated by her continued symptoms and is ready to consider more aggressive treatment options. The patient is not a diabetic.  Current Outpatient Medications:  acetaminophen  (TYLENOL ) 500 MG tablet Take 500 mg by mouth every 6 (six) hours as needed  naproxen sodium (ALEVE) 220 MG tablet Take 220 mg by mouth 2 (two) times daily as needed for Pain   Allergies: No Known Allergies  Past Medical History: No past medical history on file.  Past Surgical History:  Toe surgery Left 09/1995  Broke left pinky toe  KNEE ARTHROSCOPY Right 12/02/2023  R knee arthroscopy with meniscus and ganglion removal  FRACTURE SURGERY  toe surgery   Family History:  Hypertension Mother  Diabetes Mother  Hyperlipidemia Mother  No Known Problems Father   Social History   Socioeconomic History  Marital status: Single  Tobacco Use  Smoking status: Every Day  Current packs/day: 0.50  Types: Cigarettes  Passive exposure: Current  Smokeless tobacco: Never  Vaping Use  Vaping status: Never Used  Substance and Sexual Activity  Alcohol use: Yes  Alcohol/week: 7.0 standard drinks of alcohol  Types: 7 Standard drinks or equivalent per week  Drug use: Never  Sexual activity: Defer   Social Drivers of Health:   Physicist, medical Strain: Low Risk (12/10/2023)  Overall Financial Resource Strain (CARDIA)  Difficulty of Paying Living Expenses: Not hard at  all  Food Insecurity: No Food Insecurity (12/10/2023)  Hunger Vital Sign  Worried About Running Out of Food in the Last Year: Never true  Ran Out of Food in the Last Year: Never true  Transportation Needs: No Transportation Needs (12/10/2023)  PRAPARE - Risk analyst (Medical): No  Lack of Transportation (Non-Medical): No   Review of Systems:  A comprehensive 14 point ROS was performed, reviewed, and the pertinent orthopaedic findings are documented in the HPI.  Physical Exam: Vitals:  03/23/24 0842  BP: (!) 144/84  Weight: (!) 128.8 kg (284 lb)  Height: 170.2 cm (5' 7)  PainSc: 4  PainLoc: Finger   General/Constitutional: Pleasant overweight middle-age female in no acute distress. Neuro/Psych: Normal mood and affect, oriented to person, place and time. Eyes: Non-icteric. Pupils are equal, round, and reactive to light, and exhibit synchronous movement. ENT: Unremarkable. Lymphatic: No palpable adenopathy. Respiratory: Lungs clear to auscultation, Normal chest excursion, No wheezes, and Non-labored breathing Cardiovascular: Regular rate and rhythm. No murmurs. and No edema, swelling or tenderness, except as noted in detailed exam. Integumentary: No impressive skin lesions present, except as noted in detailed exam. Musculoskeletal: Unremarkable, except as noted in detailed exam.  Heart: Examination of the heart reveals regular, rate, and rhythm. There is no murmur noted on ascultation. There is a normal apical pulse.  Lungs: Lungs are clear to auscultation. There is no wheeze, rhonchi, or crackles. There is normal expansion of bilateral chest walls.   Left hand exam:  Skin inspection of the left thumb again is  notable for perhaps minimal swelling over the ulnar aspect of the thumb MCP joint. No erythema, ecchymosis, abrasions, or other skin abnormalities are identified. There is mild-moderate focal tenderness to palpation over the ulnar aspect of the MCP  joint. She demonstrates near full full flexion and extension of the thumb, especially including the MCP joint. She exhibits moderate laxity with pain to radial stressing of the MCP joint as the thumb can be radially stressed to approximately 45 degrees as compared to 20 degrees on her right thumb. She remains grossly neurovascularly intact to all digits.   Assessment:  1. Rupture of ulnar collateral ligament of left thumb, sequela Yes  2. Morbid obesity with BMI of 40.0-44.9, adult (CMS/HHS-HCC)   Plan: The treatment options were discussed with the patient. In addition, patient educational materials were provided regarding the diagnosis and treatment options. Regarding her left thumb symptoms, the patient is quite frustrated by her continued symptoms and function limitations, and is ready to consider more aggressive treatment options. Therefore, I have recommended a surgical procedure, specifically a reconstruction of the ulnar collateral ligament of the left thumb MCP joint. The procedure was discussed with the patient, as were the potential risks (including bleeding, infection, nerve and/or blood vessel injury, persistent or recurrent pain, stiffness of the thumb, failure of the repair, recurrence of the injury, need for further surgery, blood clots, strokes, heart attacks and/or arhythmias, pneumonia, etc.) and benefits. The patient states her understanding and wishes to proceed. All of the patient's questions and concerns were answered. She can call any time with further concerns. She will follow up post-surgery, routine.    H&P reviewed and patient re-examined. No changes.

## 2024-03-30 NOTE — Anesthesia Procedure Notes (Addendum)
 Procedure Name: LMA Insertion Date/Time: 03/30/2024 9:52 AM  Performed by: Tera Fellows, CRNAPre-anesthesia Checklist: Patient identified, Emergency Drugs available, Suction available and Patient being monitored Patient Re-evaluated:Patient Re-evaluated prior to induction Oxygen Delivery Method: Circle system utilized Preoxygenation: Pre-oxygenation with 100% oxygen Induction Type: IV induction Ventilation: Mask ventilation without difficulty LMA: LMA inserted LMA Size: 4.0 Number of attempts: 1 Placement Confirmation: positive ETCO2 and breath sounds checked- equal and bilateral Tube secured with: Tape Dental Injury: Teeth and Oropharynx as per pre-operative assessment

## 2024-03-30 NOTE — Transfer of Care (Signed)
 Immediate Anesthesia Transfer of Care Note  Patient: Melissa Dixon  Procedure(s) Performed: REPAIR, LIGAMENT (Left: Thumb)  Patient Location: PACU  Anesthesia Type:General  Level of Consciousness: awake, alert , and oriented  Airway & Oxygen Therapy: Patient Spontanous Breathing  Post-op Assessment: Report given to RN and Post -op Vital signs reviewed and stable  Post vital signs: Reviewed and stable  Last Vitals:  Vitals Value Taken Time  BP 123/62 03/30/24 11:19  Temp    Pulse 77 03/30/24 11:19  Resp 21 03/30/24 11:19  SpO2 96 % 03/30/24 11:19  Vitals shown include unfiled device data.  Last Pain:  Vitals:   03/30/24 0815  TempSrc: Temporal  PainSc: 8          Complications: No notable events documented.

## 2024-03-30 NOTE — Anesthesia Postprocedure Evaluation (Signed)
 Anesthesia Post Note  Patient: Melissa Dixon  Procedure(s) Performed: REPAIR, LIGAMENT (Left: Thumb)  Patient location during evaluation: PACU Anesthesia Type: General Level of consciousness: awake and alert Pain management: pain level controlled Vital Signs Assessment: post-procedure vital signs reviewed and stable Respiratory status: spontaneous breathing, nonlabored ventilation, respiratory function stable and patient connected to nasal cannula oxygen Cardiovascular status: blood pressure returned to baseline and stable Postop Assessment: no apparent nausea or vomiting Anesthetic complications: no   No notable events documented.   Last Vitals:  Vitals:   03/30/24 1143 03/30/24 1145  BP:  92/61  Pulse: 72 73  Resp: 12 10  Temp:    SpO2: 98% 98%    Last Pain:  Vitals:   03/30/24 1145  TempSrc:   PainSc: 2                  Lattie Poli

## 2024-03-30 NOTE — Discharge Instructions (Addendum)
 Orthopedic discharge instructions: Keep splint dry and intact. Keep hand elevated above heart level. Apply ice to affected area frequently. Take ibuprofen 600-800 mg TID with meals for 3-5 days, then as necessary. Take pain medication as prescribed or ES Tylenol when needed.  Return for follow-up in 10-14 days or as scheduled.

## 2024-03-30 NOTE — Op Note (Signed)
 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  Post-Op Diagnosis:   Same  Procedure:   Primary repair of ulnar collateral ligament with internal brace augmentation, left thumb.  Surgeon:   Lonnie Roberts, MD  Assistant:   [place] Lee Napolitano, PA-S  Anesthesia:   General LMA  Findings:   As above.  Complications:   None  EBL:   0 cc  Fluids:   400 cc crystalloid  TT:   52 minutes at 250 mmHg  Drains:   None  Closure:   3-0 Vicryl subcuticular sutures.  Implants:   3.5 mm Arthrex SwiveLocks 2  Brief Clinical Note:   The patient is a 38 year old female who sustained the above-noted injury 3 to 4 years ago following a fall. Her symptoms have persisted despite medications, activity modification, splinting, etc. The patient's history and examination are consistent with persistent disruption of the ulnar collateral ligament of the left thumb MCP joint. The patient presents at this time for repair versus reconstruction of the ulnar collateral ligament of the left thumb MCP joint.  Procedure:   The patient was brought into the operating room and lain in the supine position. After adequate general laryngeal mask anesthesia was obtained, the patient's left hand and upper extremity were prepped with ChloraPrep solution before being draped sterilely. Preoperative antibiotics were administered. A timeout was performed to verify the appropriate surgical site before the limb was exsanguinated with an Esmarch and the tourniquet inflated to 250 mmHg.   An approximately 2.5-3 cm incision was made over the ulnar aspect of the thumb MCP joint. The incision was carried down through the subcutaneous tissues with care taken to protect any neurovascular structures. No Stener lesion was identified. The adductor hood was released to permit visualization of the ulnar aspect of the thumb MCP joint. The ulnar  collateral ligament was readily visualized and appeared to still consist of a good quality tissue. It was clear that it had ruptured from the distal attachment onto the ulnar base of the proximal phalanx.   Careful dissection was carried out along the ulnar side of the base of the proximal phalanx as well as over the ulnar side of the metacarpal head just proximal to the ulnar collateral ligament origin. A 1.35 mm guidewire was inserted in the dorsal ulnar aspect of the metacarpal head at the origin of the UCL insertion site. A second guidewire was placed in the volar ulnar aspect of the base of the proximal phalanx. The position of each of these wires was verified using FluoroScan imaging before each of these guidewires was overreamed with a 3.2 mm cannulated drill. A 2-0 FiberWire suture was whipstitched through the ruptured end of the ulnar collateral ligament.  These sutures along with a 1.5 mm FiberTape were then loaded through a 3.5 mm Arthrex SwiveLock anchor and inserted into the distal hole.  The swivel lock anchor was advanced to secure both sets of sutures while maintaining the MCP joint and mild ulnar deviation to reduce stress on the UCL repair.   A second 3.5 mm Arthrex SwiveLock anchor was placed proximally after loading both ends of the 1.5 mm FiberTape. With the thumb MCP joint flexed to 30, the anchor was inserted and tightened securely to reinforce the primary repair of the ulnar collateral ligament. Gentle stretching of the repair demonstrated excellent stability. The free ends of the 2-0 FiberWire suture were  then sutured to through the dorsal capsule and tied securely to further reinforce the repair. A 4-0 Mersilene suture was used to repair the capsular tissue along the ulnar margin of the joint, as well as to repair the abductor hood.   The wound was thoroughly irrigated with sterile saline solution using bulb irrigation before the incision site was injected with 6 cc of 0.5% plain  Sensorcaine  to help with postoperative analgesia. The skin was closed using 3-0 Vicryl subcuticular sutures. Benzoin and Steri-Strips are applied to the skin before a thumb spica splint was applied to the thumb and forearm, maintaining the wrist in neutral position and the thumb in slight abduction. The patient was then awakened, extubated, and returned to the recovery room in satisfactory condition after tolerating the procedure well.

## 2024-03-30 NOTE — Anesthesia Preprocedure Evaluation (Signed)
 Anesthesia Evaluation  Patient identified by MRN, date of birth, ID band Patient awake    Reviewed: Allergy & Precautions, NPO status , Patient's Chart, lab work & pertinent test results  History of Anesthesia Complications Negative for: history of anesthetic complications  Airway Mallampati: I  TM Distance: >3 FB Neck ROM: Full    Dental no notable dental hx. (+) Teeth Intact   Pulmonary neg sleep apnea, neg COPD, Current Smoker and Patient abstained from smoking.   Pulmonary exam normal breath sounds clear to auscultation       Cardiovascular Exercise Tolerance: Good METS(-) hypertension(-) CAD and (-) Past MI negative cardio ROS (-) dysrhythmias  Rhythm:Regular Rate:Normal - Systolic murmurs    Neuro/Psych  PSYCHIATRIC DISORDERS Anxiety Depression    negative neurological ROS     GI/Hepatic ,neg GERD  ,,(+)     (-) substance abuse    Endo/Other  neg diabetes  Class 3 obesity  Renal/GU negative Renal ROS     Musculoskeletal   Abdominal  (+) + obese  Peds  Hematology   Anesthesia Other Findings Past Medical History: No date: Anxiety No date: Cervical radiculopathy No date: Complete tear of ulnar collateral ligament of  interphalangeal joint of thumb No date: Complex tear of lateral meniscus of right knee as current  injury, initial encounter No date: Cyst of knee joint, right No date: Gestational hypertension No date: Injury of left thumb No date: Pain in joints of left hand No date: Primary osteoarthritis of right knee  Reproductive/Obstetrics                             Anesthesia Physical Anesthesia Plan  ASA: 3  Anesthesia Plan: General   Post-op Pain Management: Ofirmev  IV (intra-op)* and Toradol  IV (intra-op)*   Induction: Intravenous  PONV Risk Score and Plan: 2 and Ondansetron , Dexamethasone  and Midazolam   Airway Management Planned: LMA  Additional Equipment:  None  Intra-op Plan:   Post-operative Plan: Extubation in OR  Informed Consent: I have reviewed the patients History and Physical, chart, labs and discussed the procedure including the risks, benefits and alternatives for the proposed anesthesia with the patient or authorized representative who has indicated his/her understanding and acceptance.     Dental advisory given  Plan Discussed with: CRNA and Surgeon  Anesthesia Plan Comments: (Discussed risks of anesthesia with patient, including PONV, sore throat, lip/dental/eye damage. Rare risks discussed as well, such as cardiorespiratory and neurological sequelae, and allergic reactions. Discussed the role of CRNA in patient's perioperative care. Patient understands. Patient informed about increased incidence of above perioperative risk due to high BMI. Patient understands.  Patient counseled on benefits of smoking cessation, and increased perioperative risks associated with continued smoking. )       Anesthesia Quick Evaluation

## 2024-03-31 ENCOUNTER — Encounter: Payer: Self-pay | Admitting: Surgery

## 2024-04-05 ENCOUNTER — Encounter: Payer: Self-pay | Admitting: Surgery

## 2024-05-09 NOTE — Therapy (Unsigned)
 OUTPATIENT OCCUPATIONAL THERAPY ORTHO EVALUATION  Patient Name: Melissa Dixon MRN: [id number] DOB:05-09-1986, 38 y.o., female Today's Date: 05/10/2024  PCP: Dr Tobie MART PROVIDER: Kip PA  END OF SESSION:  OT End of Session - 05/10/24 1617     Visit Number 1    Number of Visits 12    Date for OT Re-Evaluation 07/05/24    OT Start Time 1618    OT Stop Time 1700    OT Time Calculation (min) 42 min    Activity Tolerance Patient tolerated treatment well    Behavior During Therapy WFL for tasks assessed/performed          Past Medical History:  Diagnosis Date   Anxiety    Cervical radiculopathy    Complete tear of ulnar collateral ligament of interphalangeal joint of thumb    Complex tear of lateral meniscus of right knee as current injury, initial encounter    Cyst of knee joint, right    Gestational hypertension    Injury of left thumb    Pain in joints of left hand    Primary osteoarthritis of right knee    Past Surgical History:  Procedure Laterality Date   FRACTURE SURGERY Left 09/1995   5th toe   KNEE ARTHROSCOPY Right 12/02/2023   Procedure: RIGHT KNEE ARTHROSCOPY WITH DEBRIDEMENT AND PARTIAL LATERAL MENISCECTOMY AND AN OPEN EXCISION OF A CYST ON MEDIAL ASPECT OF RIGHT KNEE.;  Surgeon: Edie Norleen PARAS, MD;  Location: [facility];  Service: Orthopedics;  Laterality: Right;   LIGAMENT REPAIR Left 03/30/2024   Procedure: REPAIR, LIGAMENT;  Surgeon: Edie Norleen PARAS, MD;  Location: [facility];  Service: Orthopedics;  Laterality: Left;  ULNAR COLLATERAL  LIGAMENT REPAIR OF LEFT THUMB MCP JOINT   Patient Active Problem List   Diagnosis Date Noted   GAD (generalized anxiety disorder) 07/25/2020   MDD (major depressive disorder), recurrent episode, mild (HCC) 07/25/2020   Attention and concentration deficit 07/25/2020   Bilateral carpal tunnel syndrome 05/11/2020    ONSET DATE: 03/30/24  REFERRING DIAG: L thumb UCL repair  THERAPY DIAG:  Stiffness of thumb  joint  Pain in right hand  Stiffness of right wrist, not elsewhere classified  Scar condition and fibrosis of skin  Muscle weakness (generalized)  Rationale for Evaluation and Treatment: Rehabilitation  SUBJECTIVE:   SUBJECTIVE STATEMENT: I kept the splint on most of the time.  I take it off when I am just sitting a little bit.  But my thumb is hurting more now in the brace than in the cast.  I did some padding her my thumb. Pt accompanied by: self  PERTINENT HISTORY: 04/27/24 Ortho visit -  Impression: Rupture of ulnar collateral ligament of left thumb, sequela [S63.642S] Rupture of ulnar collateral ligament of left thumb, sequela (primary encounter diagnosis)  Plan:  1. Treatment options were discussed today with the patient. 2. Cast was removed at today's visit, skin examination demonstrates a well-healing surgical incision along the ulnar aspect of the thumb. 3. The patient was transitioned from the thumb spica cast into a thumb spica to her wrist splint. The patient was instructed to wear this at all times except for showering and she was instructed that she could come out a few times a day to work on gentle wrist motion and gentle flexion extension of her thumb but she was instructed to avoid any gripping or lifting with the left hand. 4. The patient was given a order for occupational therapy to begin working on  gentle range of motion to the left wrist, we will wait to begin working on any strengthening of the thumb until after her next postop appointment. 5. The patient will follow-up with Dr. Edie at her next scheduled visit for repeat evaluation of the thumb and increase activity level. 6. She can contact the clinic if she has any questions, new symptoms develop or symptoms worsen.   PRECAUTIONS: Ortho note for detail    WEIGHT BEARING RESTRICTIONS: NWB L hand  PAIN:  Are you having pain? 7/10 thumb webspace on the left  FALLS: Has patient fallen in last 6 months?  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  PATIENT GOALS: I want my thumb pain better my motion and strength so that I can use it.  To grip and pull and push and pinch and pick up and open things  NEXT MD VISIT: 05/13/24  OBJECTIVE:  Note: Objective measures were completed at Evaluation unless otherwise noted.  HAND DOMINANCE: Right  ADLs: Unable to use thumb in any pinching or gripping activities-using mostly fingers and left hand  FUNCTIONAL OUTCOME MEASURES: Next visit  UPPER EXTREMITY ROM:     Active ROM Right eval Left eval  Shoulder flexion    Shoulder abduction    Shoulder adduction    Shoulder extension    Shoulder internal rotation    Shoulder external rotation    Elbow flexion    Elbow extension    Wrist flexion  50  Wrist extension  55  Wrist ulnar deviation  34  Wrist radial deviation  20  Wrist pronation    Wrist supination    (Blank rows = not tested)  Active ROM Right eval Left eval  Thumb MCP (0-60) 70 15  Thumb IP (0-80) 65 20  Thumb Radial abd/add (0-55)  NT   Thumb Palmar abd/add (0-45)  NT   Thumb Opposition to Small Finger Opposition to distal palmar crease    Index MCP (0-90)     Index PIP (0-100)     Index DIP (0-70)      Long MCP (0-90)      Long PIP (0-100)      Long DIP (0-70)      Ring MCP (0-90)      Ring PIP (0-100)      Ring DIP (0-70)      Little MCP (0-90)      Little PIP (0-100)      Little DIP (0-70)      (Blank rows = not tested)  Composite flexion or fisting within normal limits  HAND FUNCTION:  Eval NT Grip strength: Right:   lbs; Left:   lbs, Lateral pinch: Right:   lbs, Left:   lbs, and 3 point pinch: Right:   lbs, Left:   lbs  COORDINATION: Impaired immobilized with thumb  SENSATION: Patient denies any sensory issues  EDEMA: Edema over the base of thumb.  COGNITION: Overall  cognitive status: Within functional limits for tasks assessed      TREATMENT DATE: 05/10/24  Modalities: Contrast bath:  Time: 8 Location: Left hand and wrist to decrease pain and edema Pain decreased from a 7/10 to 2-3/10  Patient to do contrast 3 times a day prior to Active assisted range of motion for radial ulnar deviation; active range of motion for supination pronation; active range of motion for wrist flexion and extension 10 reps pain-free Blocked thumb IP and MCP flexion 10 reps each.  Pain-free Increased 5 degrees each Patient was educated on scar massage and mobilization and handout provided and reviewed.  As well as provided Cica -Care scar pad for nighttime to use under splint. Provided extra Cica -Care scar pad for ulnar Velcro strapping webspace to decrease pain.     PATIENT EDUCATION: Education details: findings of eval and HEP  Person educated: Patient Education method: Explanation, Demonstration, Tactile cues, Verbal cues, and Handouts Education comprehension: verbalized understanding, returned demonstration, verbal cues required, and needs further education    GOALS: Goals reviewed with patient? Yes   LONG TERM GOALS: Target date: 8 wks  Patient to be independent in home program to decrease pain to less than a 2/10 at rest. Baseline: No knowledge patient's pain commands 7/10 in thumb and webspace at rest Goal status: INITIAL  2.  Left thumb flexion improved for patient to be able to do opposition to fifth digit symptom-free to initiate doing buttons and sips Baseline: Right thumb IP flexion 20 and MC flexion 15-resting pain 7/10 Goal status: INITIAL  3.  Left thumb palmar radial abduction active range of motion strength improved for patient to be able to pick up a glass and turn a doorknob symptom-free Baseline:  Goal status:  INITIAL  4.  Left thumb fine motor improved for patient to manipulate and retrieve objects from palm to fingertips symptom-free Baseline: Thumb immobilized in thumb spica Goal status: INITIAL  5.  Left t grip) strength improved to within normal range for her age to be able to hold a plate, carry 5 pounds and open to stay symptom-free Baseline: Patient immobilized in thumb spica resting pain 7/10 Goal status: INITIAL  6.  Left wrist active range of motion strength improved to within normal limits for patient to push and pull heavy door, turn doorknob and return to prior level of function symptom-free Baseline: Wrist flexion 50 and extension 55.  With some discomfort and pain Goal status: INITIAL  ASSESSMENT:  CLINICAL IMPRESSION: Patient seen today for occupational therapy evaluation for left thumb ulnar collateral ligament repair by Dr. Edie on 03/30/2024.  Patient presented with prefab thumb spica in place.  Patient wearing it all the time except with bathing and dressing.  Taking it off when just sitting on the couch.  Patient's pain increased in the thumb spica per patient to 7-8/10.  Appear Velcro straps rubbing over scar and in webspace.  Provided patient with Cica -Care scar pad as well as padding with relief of pain.  Patient with decreased wrist flexion extension more than radial ulnar deviation.  Left thumb AROM decreased in all planes and ranges and joints.  Patient limited in functional use by increased pain decreased motion and strength.  Patient can benefit from skilled OT services to decrease scar tissue and pain and increased motion and strength to return to prior level of function.  PERFORMANCE DEFICITS: in functional skills including ADLs, IADLs, ROM, strength, pain, flexibility, decreased knowledge of use of DME, and UE functional use,   and psychosocial skills including environmental adaptation and routines and behaviors.   IMPAIRMENTS:  are limiting patient from ADLs, IADLs,  rest and sleep, play, leisure, and social participation.   COMORBIDITIES: has no other co-morbidities that affects occupational performance. Patient will benefit from skilled OT to address above impairments and improve overall function.  MODIFICATION OR ASSISTANCE TO COMPLETE EVALUATION: No modification of tasks or assist necessary to complete an evaluation.  OT OCCUPATIONAL PROFILE AND HISTORY: Problem focused assessment: Including review of records relating to presenting problem.  CLINICAL DECISION MAKING: LOW - limited treatment options, no task modification necessary  REHAB POTENTIAL: Good for goals  EVALUATION COMPLEXITY: Low      PLAN:  OT FREQUENCY: 1-2x/week  OT DURATION: 8 weeks  PLANNED INTERVENTIONS: 97168 OT Re-evaluation, 97535 self care/ADL training, 02889 therapeutic exercise, 97530 therapeutic activity, 97112 neuromuscular re-education, 97140 manual therapy, 97035 ultrasound, 97018 paraffin, 02960 fluidotherapy, 97034 contrast bath, 97760 Orthotic Initial, [id number] Orthotic/Prosthetic subsequent, scar mobilization, passive range of motion, patient/family education, and DME and/or AE instructions    CONSULTED AND AGREED WITH PLAN OF CARE: Patient     Ancel Peters, OTR/L,CLT 05/10/2024, 5:17 PM

## 2024-05-10 ENCOUNTER — Encounter: Payer: Self-pay | Admitting: Occupational Therapy

## 2024-05-10 ENCOUNTER — Ambulatory Visit: Attending: Student | Admitting: Occupational Therapy

## 2024-05-10 DIAGNOSIS — M79641 Pain in right hand: Secondary | ICD-10-CM | POA: Insufficient documentation

## 2024-05-10 DIAGNOSIS — M25631 Stiffness of right wrist, not elsewhere classified: Secondary | ICD-10-CM | POA: Insufficient documentation

## 2024-05-10 DIAGNOSIS — M25649 Stiffness of unspecified hand, not elsewhere classified: Secondary | ICD-10-CM | POA: Diagnosis present

## 2024-05-10 DIAGNOSIS — M6281 Muscle weakness (generalized): Secondary | ICD-10-CM | POA: Diagnosis present

## 2024-05-10 DIAGNOSIS — L905 Scar conditions and fibrosis of skin: Secondary | ICD-10-CM | POA: Diagnosis present

## 2024-05-16 ENCOUNTER — Ambulatory Visit: Admitting: Occupational Therapy

## 2024-05-16 ENCOUNTER — Encounter: Admitting: Occupational Therapy

## 2024-05-20 ENCOUNTER — Ambulatory Visit: Admitting: Occupational Therapy

## 2024-05-20 ENCOUNTER — Encounter: Admitting: Occupational Therapy

## 2024-05-23 ENCOUNTER — Ambulatory Visit: Attending: Student | Admitting: Occupational Therapy

## 2024-05-23 DIAGNOSIS — M25649 Stiffness of unspecified hand, not elsewhere classified: Secondary | ICD-10-CM | POA: Diagnosis present

## 2024-05-23 DIAGNOSIS — L905 Scar conditions and fibrosis of skin: Secondary | ICD-10-CM | POA: Insufficient documentation

## 2024-05-23 DIAGNOSIS — M79641 Pain in right hand: Secondary | ICD-10-CM | POA: Diagnosis present

## 2024-05-23 DIAGNOSIS — M25631 Stiffness of right wrist, not elsewhere classified: Secondary | ICD-10-CM | POA: Insufficient documentation

## 2024-05-23 DIAGNOSIS — M6281 Muscle weakness (generalized): Secondary | ICD-10-CM | POA: Diagnosis present

## 2024-05-23 NOTE — Therapy (Signed)
 OUTPATIENT OCCUPATIONAL THERAPY ORTHO TREATMENT  Patient Name: Melissa Dixon MRN: [id number] DOB:February 05, 1986, 38 y.o., female Today's Date: 05/23/2024  PCP: Dr Tobie MART PROVIDER: Kip PA  END OF SESSION:  OT End of Session - 05/23/24 1537     Visit Number 2    Number of Visits 12    Date for OT Re-Evaluation 07/05/24    OT Start Time 1523    OT Stop Time 1600    OT Time Calculation (min) 37 min    Activity Tolerance Patient tolerated treatment well    Behavior During Therapy WFL for tasks assessed/performed          Past Medical History:  Diagnosis Date   Anxiety    Cervical radiculopathy    Complete tear of ulnar collateral ligament of interphalangeal joint of thumb    Complex tear of lateral meniscus of right knee as current injury, initial encounter    Cyst of knee joint, right    Gestational hypertension    Injury of left thumb    Pain in joints of left hand    Primary osteoarthritis of right knee    Past Surgical History:  Procedure Laterality Date   FRACTURE SURGERY Left 09/1995   5th toe   KNEE ARTHROSCOPY Right 12/02/2023   Procedure: RIGHT KNEE ARTHROSCOPY WITH DEBRIDEMENT AND PARTIAL LATERAL MENISCECTOMY AND AN OPEN EXCISION OF A CYST ON MEDIAL ASPECT OF RIGHT KNEE.;  Surgeon: Edie Norleen PARAS, MD;  Location: [facility];  Service: Orthopedics;  Laterality: Right;   LIGAMENT REPAIR Left 03/30/2024   Procedure: REPAIR, LIGAMENT;  Surgeon: Edie Norleen PARAS, MD;  Location: [facility];  Service: Orthopedics;  Laterality: Left;  ULNAR COLLATERAL  LIGAMENT REPAIR OF LEFT THUMB MCP JOINT   Patient Active Problem List   Diagnosis Date Noted   GAD (generalized anxiety disorder) 07/25/2020   MDD (major depressive disorder), recurrent episode, mild (HCC) 07/25/2020   Attention and concentration deficit 07/25/2020   Bilateral carpal tunnel syndrome 05/11/2020    ONSET DATE: 03/30/24  REFERRING DIAG: L thumb UCL repair  THERAPY DIAG:  Stiffness of thumb  joint  Pain in right hand  Stiffness of right wrist, not elsewhere classified  Scar condition and fibrosis of skin  Muscle weakness (generalized)  Rationale for Evaluation and Treatment: Rehabilitation  SUBJECTIVE:   SUBJECTIVE STATEMENT: I did see the surgeon on the first.  And I started weaning out of the brace.  But I have increased pain in my thumb.  I will leave it most of the day on except when I am out and about.   Pt accompanied by: self  PERTINENT HISTORY: 04/27/24 Ortho visit -  Impression: Rupture of ulnar collateral ligament of left thumb, sequela [S63.642S] Rupture of ulnar collateral ligament of left thumb, sequela (primary encounter diagnosis)  Plan:  1. Treatment options were discussed today with the patient. 2. Cast was removed at today's visit, skin examination demonstrates a well-healing surgical incision along the ulnar aspect of the thumb. 3. The patient was transitioned from the thumb spica cast into a thumb spica to her wrist splint. The patient was instructed to wear this at all times except for showering and she was instructed that she could come out a few times a day to work on gentle wrist motion and gentle flexion extension of her thumb but she was instructed to avoid any gripping or lifting with the left hand. 4. The patient was given a order for occupational therapy to begin working on  gentle range of motion to the left wrist, we will wait to begin working on any strengthening of the thumb until after her next postop appointment. 5. The patient will follow-up with Dr. Edie at her next scheduled visit for repeat evaluation of the thumb and increase activity level. 6. She can contact the clinic if she has any questions, new symptoms develop or symptoms worsen.   PRECAUTIONS: Ortho note for detail    WEIGHT BEARING RESTRICTIONS: NWB L hand  PAIN:  Are you having pain? 4/10 at thumb   FALLS: Has patient fallen in last 6 months? 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  PATIENT GOALS: I want my thumb pain better my motion and strength so that I can use it.  To grip and pull and push and pinch and pick up and open things  NEXT MD VISIT: 05/13/24  OBJECTIVE:  Note: Objective measures were completed at Evaluation unless otherwise noted.  HAND DOMINANCE: Right  ADLs: Unable to use thumb in any pinching or gripping activities-using mostly fingers and left hand  FUNCTIONAL OUTCOME MEASURES: Next visit  UPPER EXTREMITY ROM:     Active ROM Right eval Left eval L 05/23/24  Shoulder flexion     Shoulder abduction     Shoulder adduction     Shoulder extension     Shoulder internal rotation     Shoulder external rotation     Elbow flexion     Elbow extension     Wrist flexion  50 72  Wrist extension  55 67  Wrist ulnar deviation  34 35  Wrist radial deviation  20 20  Wrist pronation     Wrist supination     (Blank rows = not tested)  Active ROM Right eval Left eval L 05/23/24  Thumb MCP (0-60) 70 15 30  Thumb IP (0-80) 65 20 20   Thumb Radial abd/add (0-55)  NT  50  Thumb Palmar abd/add (0-45)  NT  65  Thumb Opposition to Small Finger Opposition to distal palmar crease     Index MCP (0-90)      Index PIP (0-100)      Index DIP (0-70)       Long MCP (0-90)       Long PIP (0-100)       Long DIP (0-70)       Ring MCP (0-90)       Ring PIP (0-100)       Ring DIP (0-70)       Little MCP (0-90)       Little PIP (0-100)       Little DIP (0-70)       (Blank rows = not tested)  Composite flexion or fisting within normal limits  HAND FUNCTION:  Eval NT Grip strength: Right:   lbs; Left:   lbs, Lateral pinch: Right:   lbs, Left:   lbs, and 3 point pinch: Right:   lbs, Left:   lbs  COORDINATION: Impaired immobilized with thumb  SENSATION: Patient denies any sensory  issues  EDEMA: Edema over the base of thumb.  COGNITION: Overall cognitive status: Within functional limits for tasks assessed      TREATMENT DATE: 05/23/24  Patient arrived with increased wrist motion as well as thumb.  See flowsheet  pain continues to be increased.  Patient has been leaving of thumb spica most of the day. Reviewed with patient to wean gradually out of the splint keeping pain under 2/10.  Did not initiate strengthening yet. Patient can do thumb spica 2 hours on 2 hours off. Can continue with contrast or heat prior to her exercises     Modalities: Fluidotherapy:  Time: 8 Location: Left hand and wrist to decrease pain and edema Pain decreased from a 7/10 to 2-3/10   Scar mobilization was done.  Focusing on lateral part that is tender.  Patient scar improved greatly.  Patient to continue with Cica -Care scar pad at nighttime. Metacarpal spreads and webspace soft tissue mobs done as well as joint mobs and soft tissue to wrist - prior and in combination with wrist mobility and all ranges prior to range of motion  Patient to do contrast or heat 3 times a day prior to Active assisted range of motion gentle passive range of motion for wrist flexion extension 12 reps  Gentle passive range of motion to blocked thumb IP and MCP flexion 10 reps each.  Pain-free Oppositio to 2nd through 4th.  8 reps Opposition to all digits picking up 2 cm foam block pain-free Active assisted range of motion for palmar radial abduction 12 reps  Patient was educated on scar massage and mobilization and handout provided and reviewed.  As well as provided Cica -Care scar pad for nighttime to use under splint. Provided extra Cica -Care scar pad for ulnar Velcro strapping webspace to decrease pain.     PATIENT EDUCATION: Education details: findings of eval and HEP   Person educated: Patient Education method: Explanation, Demonstration, Tactile cues, Verbal cues, and Handouts Education comprehension: verbalized understanding, returned demonstration, verbal cues required, and needs further education    GOALS: Goals reviewed with patient? Yes   LONG TERM GOALS: Target date: 8 wks  Patient to be independent in home program to decrease pain to less than a 2/10 at rest. Baseline: No knowledge patient's pain commands 7/10 in thumb and webspace at rest Goal status: INITIAL  2.  Left thumb flexion improved for patient to be able to do opposition to fifth digit symptom-free to initiate doing buttons and sips Baseline: Right thumb IP flexion 20 and MC flexion 15-resting pain 7/10 Goal status: INITIAL  3.  Left thumb palmar radial abduction active range of motion strength improved for patient to be able to pick up a glass and turn a doorknob symptom-free Baseline:  Goal status: INITIAL  4.  Left thumb fine motor improved for patient to manipulate and retrieve objects from palm to fingertips symptom-free Baseline: Thumb immobilized in thumb spica Goal status: INITIAL  5.  Left t grip) strength improved to within normal range for her age to be able to hold a plate, carry 5 pounds and open to stay symptom-free Baseline: Patient immobilized in thumb spica resting pain 7/10 Goal status: INITIAL  6.  Left wrist active range of motion strength improved to within normal limits for patient to push and pull heavy door, turn doorknob and return to prior level of function symptom-free Baseline: Wrist flexion 50 and extension 55.  With some discomfort and pain Goal status: INITIAL  ASSESSMENT:  CLINICAL IMPRESSION: Patient seen today for occupational therapy evaluation for left thumb ulnar collateral ligament repair by Dr. Edie on 03/30/2024.  Patient presented with prefab thumb spica  in place.  Patient wearing it all the time except with bathing and dressing.   Taking it off when just sitting on the couch.  Patient's pain increased in the thumb spica per patient to 7-8/10.  Appear Velcro straps rubbing over scar and in webspace.  Provided patient with Cica -Care scar pad as well as padding with relief of pain.  Patient with decreased wrist flexion extension more than radial ulnar deviation.  NOW patient return with great improvement in wrist range of motion as well as thumb palmar radial abduction.  Scar continue to be tender.  Pain was increased because patient was not wearing the splint at all during the day.  Recommend for patient to gradually decrease wearing time on and off during the day.  Patient limited in thumb IP MC flexion as well as opposition.  Upgrade patient's home program.  Left thumb AROM decreased in all planes and ranges and joints.  Patient limited in functional use by increased pain decreased motion and strength.  Patient can benefit from skilled OT services to decrease scar tissue and pain and increased motion and strength to return to prior level of function.  PERFORMANCE DEFICITS: in functional skills including ADLs, IADLs, ROM, strength, pain, flexibility, decreased knowledge of use of DME, and UE functional use,   and psychosocial skills including environmental adaptation and routines and behaviors.   IMPAIRMENTS: are limiting patient from ADLs, IADLs, rest and sleep, play, leisure, and social participation.   COMORBIDITIES: has no other co-morbidities that affects occupational performance. Patient will benefit from skilled OT to address above impairments and improve overall function.  MODIFICATION OR ASSISTANCE TO COMPLETE EVALUATION: No modification of tasks or assist necessary to complete an evaluation.  OT OCCUPATIONAL PROFILE AND HISTORY: Problem focused assessment: Including review of records relating to presenting problem.  CLINICAL DECISION MAKING: LOW - limited treatment options, no task modification necessary  REHAB  POTENTIAL: Good for goals  EVALUATION COMPLEXITY: Low      PLAN:  OT FREQUENCY: 1-2x/week  OT DURATION: 8 weeks  PLANNED INTERVENTIONS: 97168 OT Re-evaluation, 97535 self care/ADL training, 02889 therapeutic exercise, 97530 therapeutic activity, 97112 neuromuscular re-education, 97140 manual therapy, 97035 ultrasound, 97018 paraffin, 02960 fluidotherapy, 97034 contrast bath, 97760 Orthotic Initial, [id number] Orthotic/Prosthetic subsequent, scar mobilization, passive range of motion, patient/family education, and DME and/or AE instructions    CONSULTED AND AGREED WITH PLAN OF CARE: Patient     Ancel Peters, OTR/L,CLT 05/23/2024, 4:02 PM

## 2024-05-26 ENCOUNTER — Ambulatory Visit: Admitting: Occupational Therapy

## 2024-05-26 DIAGNOSIS — M6281 Muscle weakness (generalized): Secondary | ICD-10-CM

## 2024-05-26 DIAGNOSIS — L905 Scar conditions and fibrosis of skin: Secondary | ICD-10-CM

## 2024-05-26 DIAGNOSIS — M25631 Stiffness of right wrist, not elsewhere classified: Secondary | ICD-10-CM

## 2024-05-26 DIAGNOSIS — M79641 Pain in right hand: Secondary | ICD-10-CM

## 2024-05-26 DIAGNOSIS — M25649 Stiffness of unspecified hand, not elsewhere classified: Secondary | ICD-10-CM

## 2024-05-29 ENCOUNTER — Encounter: Payer: Self-pay | Admitting: Occupational Therapy

## 2024-05-29 NOTE — Therapy (Unsigned)
 OUTPATIENT OCCUPATIONAL THERAPY ORTHO TREATMENT  Patient Name: Melissa Dixon MRN: [id number] DOB:August 09, 1986, 38 y.o., female Today's Date: 05/29/2024  PCP: Dr Tobie MART PROVIDER: Kip PA  END OF SESSION:  OT End of Session - 05/29/24 2156     Visit Number 3    Number of Visits 12    Date for OT Re-Evaluation 07/05/24    OT Start Time 1400    OT Stop Time 1445    OT Time Calculation (min) 45 min    Activity Tolerance Patient tolerated treatment well    Behavior During Therapy [facility] for tasks assessed/performed          Past Medical History:  Diagnosis Date   Anxiety    Cervical radiculopathy    Complete tear of ulnar collateral ligament of interphalangeal joint of thumb    Complex tear of lateral meniscus of right knee as current injury, initial encounter    Cyst of knee joint, right    Gestational hypertension    Injury of left thumb    Pain in joints of left hand    Primary osteoarthritis of right knee    Past Surgical History:  Procedure Laterality Date   FRACTURE SURGERY Left 09/1995   5th toe   KNEE ARTHROSCOPY Right 12/02/2023   Procedure: RIGHT KNEE ARTHROSCOPY WITH DEBRIDEMENT AND PARTIAL LATERAL MENISCECTOMY AND AN OPEN EXCISION OF A CYST ON MEDIAL ASPECT OF RIGHT KNEE.;  Surgeon: Edie Norleen PARAS, MD;  Location: [facility] ORS;  Service: Orthopedics;  Laterality: Right;   LIGAMENT REPAIR Left 03/30/2024   Procedure: REPAIR, LIGAMENT;  Surgeon: Edie Norleen PARAS, MD;  Location: [facility] ORS;  Service: Orthopedics;  Laterality: Left;  ULNAR COLLATERAL  LIGAMENT REPAIR OF LEFT THUMB MCP JOINT   Patient Active Problem List   Diagnosis Date Noted   GAD (generalized anxiety disorder) 07/25/2020   MDD (major depressive disorder), recurrent episode, mild (HCC) 07/25/2020   Attention and concentration deficit 07/25/2020   Bilateral carpal tunnel syndrome 05/11/2020    ONSET DATE: 03/30/24  REFERRING DIAG: L thumb UCL repair  THERAPY DIAG:  Stiffness of thumb  joint  Pain in right hand  Stiffness of right wrist, not elsewhere classified  Scar condition and fibrosis of skin  Muscle weakness (generalized)  Rationale for Evaluation and Treatment: Rehabilitation  SUBJECTIVE:   SUBJECTIVE STATEMENT: See below in treatment note for subjective   Pt accompanied by: self  PERTINENT HISTORY: 04/27/24 Ortho visit -  Impression: Rupture of ulnar collateral ligament of left thumb, sequela [S63.642S] Rupture of ulnar collateral ligament of left thumb, sequela (primary encounter diagnosis)  Plan:  1. Treatment options were discussed today with the patient. 2. Cast was removed at today's visit, skin examination demonstrates a well-healing surgical incision along the ulnar aspect of the thumb. 3. The patient was transitioned from the thumb spica cast into a thumb spica to her wrist splint. The patient was instructed to wear this at all times except for showering and she was instructed that she could come out a few times a day to work on gentle wrist motion and gentle flexion extension of her thumb but she was instructed to avoid any gripping or lifting with the left hand. 4. The patient was given a order for occupational therapy to begin working on gentle range of motion to the left wrist, we will wait to begin working on any strengthening of the thumb until after her next postop appointment. 5. The patient will follow-up with Dr. Edie at her  next scheduled visit for repeat evaluation of the thumb and increase activity level. 6. She can contact the clinic if she has any questions, new symptoms develop or symptoms worsen.   PRECAUTIONS: Ortho note for detail    WEIGHT BEARING RESTRICTIONS: NWB L hand  PAIN:  Are you having pain? 4/10 at thumb   FALLS: Has patient fallen in last 6 months? 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  PATIENT GOALS: I want my thumb pain better my motion and strength so that I can use it.  To grip and pull and push and pinch and pick up and open things  NEXT MD VISIT: 05/13/24  OBJECTIVE:  Note: Objective measures were completed at Evaluation unless otherwise noted.  HAND DOMINANCE: Right  ADLs: Unable to use thumb in any pinching or gripping activities-using mostly fingers and left hand  FUNCTIONAL OUTCOME MEASURES: Next visit  UPPER EXTREMITY ROM:     Active ROM Right eval Left eval L 05/23/24  Shoulder flexion     Shoulder abduction     Shoulder adduction     Shoulder extension     Shoulder internal rotation     Shoulder external rotation     Elbow flexion     Elbow extension     Wrist flexion  50 72  Wrist extension  55 67  Wrist ulnar deviation  34 35  Wrist radial deviation  20 20  Wrist pronation     Wrist supination     (Blank rows = not tested)  Active ROM Right eval Left eval L 05/23/24 L 05/26/24  Thumb MCP (0-60) 70 15 30 40  Thumb IP (0-80) 65 20 20  35  Thumb Radial abd/add (0-55)  NT  50 50  Thumb Palmar abd/add (0-45)  NT  65 65  Thumb Opposition to Small Finger Opposition to distal palmar crease      Index MCP (0-90)       Index PIP (0-100)       Index DIP (0-70)        Long MCP (0-90)        Long PIP (0-100)        Long DIP (0-70)        Ring MCP (0-90)        Ring PIP (0-100)        Ring DIP (0-70)        Little MCP (0-90)        Little PIP (0-100)        Little DIP (0-70)        (Blank rows = not tested)  Composite flexion or fisting within normal limits  HAND FUNCTION:  Eval NT Grip strength: Right:   lbs; Left:   lbs, Lateral pinch: Right:   lbs, Left:   lbs, and 3 point pinch: Right:   lbs, Left:   lbs  COORDINATION: Impaired immobilized with thumb  SENSATION: Patient denies any sensory issues  EDEMA: Edema over the base of thumb.  COGNITION: Overall cognitive status: Within functional limits for tasks  assessed  TREATMENT DATE: 05/26/24  Pt reports pain in the left 5/10 at the beginning of session.  She reports she has been helping move her sons to college, increased pain may be from increased use with these task demands.   Modalities: Fluidotherapy:  Time: 8 Location: Left hand and wrist to decrease pain and edema Pain 5/10 and decreased to 2-3/10  Manual therapy:   Following fluidotherapy, pt seen this date for soft tissue massage and mobilizations for carpal and metacarpal spreads, webspace soft tissue and prolonged stretch.  Use of pressure clip to webspace.  Scar mobilization performed by therapist to increase scar mobility and decrease risk for adhesions.  Patient to continue with [employer] -[employer] scar pad at nighttime.  Therapeutic Exercises:   Active assisted range of motion gentle passive range of motion for wrist flexion extension 12 reps  Gentle passive range of motion to blocked thumb IP and MCP flexion 10 reps each.  Pain-free Opposition to 2nd through 4th,   8 reps Opposition to all digits picking up 2 cm foam block pain-free Active assisted range of motion for palmar radial abduction 12 reps Use of paper under thumb for radial ABD on tabletop Pt with increased thumb MP and IP flexion this date  Patient attempting to wean gradually out of the splint keeping pain under 2/10 Patient can do thumb spica 2 hours on 2 hours off, wearing when performing increased task demands continue with contrast or heat prior to her exercises, discussed working on consistency with contrast min 2 times a day and preferred 3 times, ending with warm prior to exercises.     PATIENT EDUCATION: Education details: findings of eval and HEP  Person educated: Patient Education method: Explanation, Demonstration, Tactile cues, Verbal cues, and Handouts Education comprehension:  verbalized understanding, returned demonstration, verbal cues required, and needs further education    GOALS: Goals reviewed with patient? Yes   LONG TERM GOALS: Target date: 8 wks  Patient to be independent in home program to decrease pain to less than a 2/10 at rest. Baseline: No knowledge patient's pain commands 7/10 in thumb and webspace at rest Goal status: INITIAL  2.  Left thumb flexion improved for patient to be able to do opposition to fifth digit symptom-free to initiate doing buttons and sips Baseline: Right thumb IP flexion 20 and MC flexion 15-resting pain 7/10 Goal status: INITIAL  3.  Left thumb palmar radial abduction active range of motion strength improved for patient to be able to pick up a glass and turn a doorknob symptom-free Baseline:  Goal status: INITIAL  4.  Left thumb fine motor improved for patient to manipulate and retrieve objects from palm to fingertips symptom-free Baseline: Thumb immobilized in thumb spica Goal status: INITIAL  5.  Left t grip) strength improved to within normal range for her age to be able to hold a plate, carry 5 pounds and open to stay symptom-free Baseline: Patient immobilized in thumb spica resting pain 7/10 Goal status: INITIAL  6.  Left wrist active range of motion strength improved to within normal limits for patient to push and pull heavy door, turn doorknob and return to prior level of function symptom-free Baseline: Wrist flexion 50 and extension 55.  With some discomfort and pain Goal status: INITIAL  ASSESSMENT:  CLINICAL IMPRESSION: Patient seen today for occupational therapy evaluation for left thumb ulnar collateral ligament repair by Dr. Edie on 03/30/2024.  Patient presented with prefab thumb spica in place. Pain 5/10 this date.  Pt demonstrating great improvement in  wrist range of motion as well as thumb palmar radial abduction, she was able to make significant progress with thumb MP and IP flexion this date.  Scar less tender today and able to tolerate manual massage. Patient has been working the last few days to gradually decrease wearing time on and off during the day.  Opposition to middle finger this date and has pain to ring finger.  AROM decreased in all planes and ranges and joints but improving over the last couple sessions.  Pt been busy with helping her sons move to college but plans to focus more on exercises and use of contrast to decrease pain, edema and increase motion.   Patient remains limited in functional use by increased pain decreased motion and strength.  Patient can benefit from skilled OT services to decrease scar tissue and pain and increased motion and strength to return to prior level of function.  PERFORMANCE DEFICITS: in functional skills including ADLs, IADLs, ROM, strength, pain, flexibility, decreased knowledge of use of DME, and UE functional use,   and psychosocial skills including environmental adaptation and routines and behaviors.   IMPAIRMENTS: are limiting patient from ADLs, IADLs, rest and sleep, play, leisure, and social participation.   COMORBIDITIES: has no other co-morbidities that affects occupational performance. Patient will benefit from skilled OT to address above impairments and improve overall function.  MODIFICATION OR ASSISTANCE TO COMPLETE EVALUATION: No modification of tasks or assist necessary to complete an evaluation.  OT OCCUPATIONAL PROFILE AND HISTORY: Problem focused assessment: Including review of records relating to presenting problem.  CLINICAL DECISION MAKING: LOW - limited treatment options, no task modification necessary  REHAB POTENTIAL: Good for goals  EVALUATION COMPLEXITY: Low   PLAN:  OT FREQUENCY: 1-2x/week  OT DURATION: 8 weeks  PLANNED INTERVENTIONS: 97168 OT Re-evaluation, 97535 self care/ADL training, 02889 therapeutic exercise, 97530 therapeutic activity, 97112 neuromuscular re-education, 97140 manual therapy, 97035  ultrasound, 97018 paraffin, 02960 fluidotherapy, 97034 contrast bath, 97760 Orthotic Initial, [id number] Orthotic/Prosthetic subsequent, scar mobilization, passive range of motion, patient/family education, and DME and/or AE instructions  CONSULTED AND AGREED WITH PLAN OF CARE: Patient  Kshawn Canal, OTR/L,CLT 05/29/2024, 9:57 PM

## 2024-05-30 ENCOUNTER — Ambulatory Visit: Admitting: Occupational Therapy

## 2024-05-30 DIAGNOSIS — M25649 Stiffness of unspecified hand, not elsewhere classified: Secondary | ICD-10-CM

## 2024-05-30 DIAGNOSIS — M25631 Stiffness of right wrist, not elsewhere classified: Secondary | ICD-10-CM

## 2024-05-30 DIAGNOSIS — L905 Scar conditions and fibrosis of skin: Secondary | ICD-10-CM

## 2024-05-30 DIAGNOSIS — M79641 Pain in right hand: Secondary | ICD-10-CM

## 2024-05-30 DIAGNOSIS — M6281 Muscle weakness (generalized): Secondary | ICD-10-CM

## 2024-05-30 NOTE — Therapy (Signed)
 OUTPATIENT OCCUPATIONAL THERAPY ORTHO TREATMENT  Patient Name: Melissa Dixon MRN: [id number] DOB:[birth year], 38 y.o., female Today's Date: 05/30/2024  PCP: Dr Tobie MART PROVIDER: Kip PA  END OF SESSION:  OT End of Session - 05/30/24 1023     Visit Number 4    Number of Visits 12    Date for OT Re-Evaluation 07/05/24    OT Start Time 1024    OT Stop Time 1103    OT Time Calculation (min) 39 min    Activity Tolerance Patient tolerated treatment well    Behavior During Therapy WFL for tasks assessed/performed          Past Medical History:  Diagnosis Date   Anxiety    Cervical radiculopathy    Complete tear of ulnar collateral ligament of interphalangeal joint of thumb    Complex tear of lateral meniscus of right knee as current injury, initial encounter    Cyst of knee joint, right    Gestational hypertension    Injury of left thumb    Pain in joints of left hand    Primary osteoarthritis of right knee    Past Surgical History:  Procedure Laterality Date   FRACTURE SURGERY Left 09/1995   5th toe   KNEE ARTHROSCOPY Right 12/02/2023   Procedure: RIGHT KNEE ARTHROSCOPY WITH DEBRIDEMENT AND PARTIAL LATERAL MENISCECTOMY AND AN OPEN EXCISION OF A CYST ON MEDIAL ASPECT OF RIGHT KNEE.;  Surgeon: Edie Norleen PARAS, MD;  Location: [facility];  Service: Orthopedics;  Laterality: Right;   LIGAMENT REPAIR Left 03/30/2024   Procedure: REPAIR, LIGAMENT;  Surgeon: Edie Norleen PARAS, MD;  Location: [facility];  Service: Orthopedics;  Laterality: Left;  ULNAR COLLATERAL  LIGAMENT REPAIR OF LEFT THUMB MCP JOINT   Patient Active Problem List   Diagnosis Date Noted   GAD (generalized anxiety disorder) 07/25/2020   MDD (major depressive disorder), recurrent episode, mild (HCC) 07/25/2020   Attention and concentration deficit 07/25/2020   Bilateral carpal tunnel syndrome 05/11/2020    ONSET DATE: 03/30/24  REFERRING DIAG: L thumb UCL repair  THERAPY DIAG:  Stiffness of thumb  joint  Pain in right hand  Stiffness of right wrist, not elsewhere classified  Scar condition and fibrosis of skin  Muscle weakness (generalized)  Rationale for Evaluation and Treatment: Rehabilitation  SUBJECTIVE:   SUBJECTIVE STATEMENT: Patient is actually better today coming in.  I tried to use heat.  I have a new heating pad that goes in the microwave.  Still have trouble getting to my ring finger and pinky with my thumb.  I moved to my son's and to college Pt accompanied by: self  PERTINENT HISTORY: 04/27/24 Ortho visit -  Impression: Rupture of ulnar collateral ligament of left thumb, sequela [S63.642S] Rupture of ulnar collateral ligament of left thumb, sequela (primary encounter diagnosis)  Plan:  1. Treatment options were discussed today with the patient. 2. Cast was removed at today's visit, skin examination demonstrates a well-healing surgical incision along the ulnar aspect of the thumb. 3. The patient was transitioned from the thumb spica cast into a thumb spica to her wrist splint. The patient was instructed to wear this at all times except for showering and she was instructed that she could come out a few times a day to work on gentle wrist motion and gentle flexion extension of her thumb but she was instructed to avoid any gripping or lifting with the left hand. 4. The patient was given a order for occupational therapy to  begin working on gentle range of motion to the left wrist, we will wait to begin working on any strengthening of the thumb until after her next postop appointment. 5. The patient will follow-up with Dr. Edie at her next scheduled visit for repeat evaluation of the thumb and increase activity level. 6. She can contact the clinic if she has any questions, new symptoms develop or symptoms worsen.   PRECAUTIONS: Ortho note for detail    WEIGHT BEARING RESTRICTIONS: NWB L hand  PAIN:  Are you having pain? 2/10 at thumb   FALLS: Has patient fallen  in last 6 months? 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  PATIENT GOALS: I want my thumb pain better my motion and strength so that I can use it.  To grip and pull and push and pinch and pick up and open things  NEXT MD VISIT: 05/13/24  OBJECTIVE:  Note: Objective measures were completed at Evaluation unless otherwise noted.  HAND DOMINANCE: Right  ADLs: Unable to use thumb in any pinching or gripping activities-using mostly fingers and left hand  FUNCTIONAL OUTCOME MEASURES: Next visit  UPPER EXTREMITY ROM:     Active ROM Right eval Left eval L 05/23/24  Shoulder flexion     Shoulder abduction     Shoulder adduction     Shoulder extension     Shoulder internal rotation     Shoulder external rotation     Elbow flexion     Elbow extension     Wrist flexion  50 72  Wrist extension  55 67  Wrist ulnar deviation  34 35  Wrist radial deviation  20 20  Wrist pronation     Wrist supination     (Blank rows = not tested)  Active ROM Right eval Left eval L 05/23/24 L 05/26/24  Thumb MCP (0-60) 70 15 30 40  Thumb IP (0-80) 65 20 20  35  Thumb Radial abd/add (0-55)  NT  50 50  Thumb Palmar abd/add (0-45)  NT  65 65  Thumb Opposition to Small Finger Opposition to distal palmar crease      Index MCP (0-90)       Index PIP (0-100)       Index DIP (0-70)        Long MCP (0-90)        Long PIP (0-100)        Long DIP (0-70)        Ring MCP (0-90)        Ring PIP (0-100)        Ring DIP (0-70)        Little MCP (0-90)        Little PIP (0-100)        Little DIP (0-70)        (Blank rows = not tested)  Composite flexion or fisting within normal limits  HAND FUNCTION: 05/30/24 Grip strength: Right: 60 lbs; Left: 18 lbs, Lateral pinch: Right: 20 lbs, Left: 3 lbs, and 3 point pinch: Right: 14 lbs, Left: 3 lbs  COORDINATION: Impaired  immobilized with thumb  SENSATION: Patient denies any sensory issues  EDEMA: Edema over the base of thumb.  COGNITION: Overall cognitive status: Within functional limits for tasks assessed  TREATMENT DATE: 05/30/24  Pt reports pain in the left  thumb 2/10 at the beginning of session.  Feels her pain is a lot better than it was last week.  Got a new heating pad. Assess strength in the thumb palmar radial abduction 4 -/5  as well as grip and pinch strength see flowsheets Patient reports still some pain and discomfort at endrange for wrist flexion extension.  Modalities: Fluidotherapy:  Time: 8 Location: Left hand and wrist to decrease pain  Pain decreased to less than a 1/10 with thumb and wrist Manual therapy:   Following fluidotherapy, pt seen this date for soft tissue massage and mobilizations for carpal and metacarpal spreads, webspace soft tissue and prolonged stretch.   Graston tool #2 done for sweeping over the volar and radial  forearm and hand.  Patient with multiply trigger points and tightness over the radial  forearm. Done some dry cupping 5 cm cups done 2 at a time over the radial extensors proximal and distal with a light pressure and static with AAROM wrist RD/UD,flexion extension 10 reps.  Repeated 3 times   Therapeutic Exercises:     passive range of motion to blocked thumb IP and MCP flexion 10 reps each.  Pain-free Opposition to 2nd through 5th,   8 reps place and hold to 5th at [facility] Opposition to all digits picking up 2 cm foam block pain-free Active assisted range of motion for palmar radial abduction 12 reps Add isometric strength for thumb PA and RA 15 reps- 2 sets and add to HEP  At 16 ounce hammer for supination pronation as well as radial ulnar deviation 2 sets of 12 pain-free.  Twice a day For wrist flexion providing us  gentle stretch  into extension 12-15 reps 2 times a day 1 set Light blue putty for gripping as well as lateral pinch.  12-15 reps 2 times a day Reinforced with patient pain-free using the putty as well as with lateral pinch keeping alignment.  Patient to do 5 times a day or every 2 hours opposition placing hold to 4th and 5th.  As well as attempting sliding down to the distal crease on volar 4th and 5th.  Patient attempting to wean gradually out of the splint keeping pain under 2/10 Patient can do thumb spica 2 hours on 2 hours off, wearing when performing increased task demands continue with contrast or heat prior to her exercises, discussed working on consistency with contrast min 2 times a day and preferred 3 times, ending with warm prior to exercises.     PATIENT EDUCATION: Education details: findings of eval and HEP  Person educated: Patient Education method: Explanation, Demonstration, Tactile cues, Verbal cues, and Handouts Education comprehension: verbalized understanding, returned demonstration, verbal cues required, and needs further education    GOALS: Goals reviewed with patient? Yes   LONG TERM GOALS: Target date: 8 wks  Patient to be independent in home program to decrease pain to less than a 2/10 at rest. Baseline: No knowledge patient's pain commands 7/10 in thumb and webspace at rest Goal status: INITIAL  2.  Left thumb flexion improved for patient to be able to do opposition to fifth digit symptom-free to initiate doing buttons and sips Baseline: Right thumb IP flexion 20 and MC flexion 15-resting pain 7/10 Goal status: INITIAL  3.  Left thumb palmar radial abduction active range of motion strength improved for patient to be able to pick up a glass and turn a doorknob symptom-free Baseline:  Goal status: INITIAL  4.  Left thumb fine motor improved for patient to manipulate and retrieve objects from palm to fingertips symptom-free Baseline: Thumb immobilized in thumb spica Goal  status: INITIAL  5.  Left t grip) strength improved to within normal range for her age to be able to hold a plate, carry 5 pounds and open to stay symptom-free Baseline: Patient immobilized in thumb spica resting pain 7/10 Goal status: INITIAL  6.  Left wrist active range of motion strength improved to within normal limits for patient to push and pull heavy door, turn doorknob and return to prior level of function symptom-free Baseline: Wrist flexion 50 and extension 55.  With some discomfort and pain Goal status: INITIAL  ASSESSMENT:  CLINICAL IMPRESSION: Patient seen today for occupational therapy  for left thumb ulnar collateral ligament repair by Dr. Edie on 03/30/2024.  Patient presented with prefab thumb spica in place. Pain 5/10 at eval.   Pt cont to make great progress in thumb palmar radial abduction, she was able to make significant progress with thumb MP and IP flexion this date-actively to the fourth and placing hold to the fifth.  Patient to increase gentle passive range of motion of the place and hold to the 4th and 5th digit as well as sliding to the distal fold 5 times a day.  Encouraged patient to use her heat.. Scar less tender today and able to tolerate manual massage.  Initiated strengthening for the wrist 1 pound weight or 16 ounce hammer tolerating well.  As well as isometric strengthening for thumb palmar radial abduction.  Pt was busy with helping her sons move to college but plans to focus more on exercises and use of contrast to decrease pain, edema and increase motion.   Patient remains limited in functional use by increased pain decreased motion and strength.  Patient can benefit from skilled OT services to decrease scar tissue and pain and increased motion and strength to return to prior level of function.  PERFORMANCE DEFICITS: in functional skills including ADLs, IADLs, ROM, strength, pain, flexibility, decreased knowledge of use of DME, and UE functional use,   and  psychosocial skills including environmental adaptation and routines and behaviors.   IMPAIRMENTS: are limiting patient from ADLs, IADLs, rest and sleep, play, leisure, and social participation.   COMORBIDITIES: has no other co-morbidities that affects occupational performance. Patient will benefit from skilled OT to address above impairments and improve overall function.  MODIFICATION OR ASSISTANCE TO COMPLETE EVALUATION: No modification of tasks or assist necessary to complete an evaluation.  OT OCCUPATIONAL PROFILE AND HISTORY: Problem focused assessment: Including review of records relating to presenting problem.  CLINICAL DECISION MAKING: LOW - limited treatment options, no task modification necessary  REHAB POTENTIAL: Good for goals  EVALUATION COMPLEXITY: Low   PLAN:  OT FREQUENCY: 1-2x/week  OT DURATION: 8 weeks  PLANNED INTERVENTIONS: 97168 OT Re-evaluation, 97535 self care/ADL training, 02889 therapeutic exercise, 97530 therapeutic activity, 97112 neuromuscular re-education, 97140 manual therapy, 97035 ultrasound, 97018 paraffin, 02960 fluidotherapy, 97034 contrast bath, 97760 Orthotic Initial, [id number] Orthotic/Prosthetic subsequent, scar mobilization, passive range of motion, patient/family education, and DME and/or AE instructions  CONSULTED AND AGREED WITH PLAN OF CARE: Patient  Ancel Peters, OTR/L,CLT 05/30/2024, 11:04 AM

## 2024-06-02 ENCOUNTER — Ambulatory Visit: Admitting: Occupational Therapy

## 2024-06-02 DIAGNOSIS — M25649 Stiffness of unspecified hand, not elsewhere classified: Secondary | ICD-10-CM

## 2024-06-02 DIAGNOSIS — M25631 Stiffness of right wrist, not elsewhere classified: Secondary | ICD-10-CM

## 2024-06-02 DIAGNOSIS — M6281 Muscle weakness (generalized): Secondary | ICD-10-CM

## 2024-06-02 DIAGNOSIS — M79641 Pain in right hand: Secondary | ICD-10-CM

## 2024-06-02 DIAGNOSIS — L905 Scar conditions and fibrosis of skin: Secondary | ICD-10-CM

## 2024-06-02 NOTE — Therapy (Signed)
 OUTPATIENT OCCUPATIONAL THERAPY ORTHO TREATMENT  Patient Name: Melissa Dixon MRN: [id number] DOB:May 12, 1986, 38 y.o., female Today's Date: 06/02/2024  PCP: Dr Tobie MART PROVIDER: Kip PA  END OF SESSION:  OT End of Session - 06/02/24 1058     Visit Number 5    Number of Visits 12    Date for OT Re-Evaluation 07/05/24    OT Start Time 1100    OT Stop Time 1144    OT Time Calculation (min) 44 min    Activity Tolerance Patient tolerated treatment well    Behavior During Therapy WFL for tasks assessed/performed          Past Medical History:  Diagnosis Date   Anxiety    Cervical radiculopathy    Complete tear of ulnar collateral ligament of interphalangeal joint of thumb    Complex tear of lateral meniscus of right knee as current injury, initial encounter    Cyst of knee joint, right    Gestational hypertension    Injury of left thumb    Pain in joints of left hand    Primary osteoarthritis of right knee    Past Surgical History:  Procedure Laterality Date   FRACTURE SURGERY Left 09/1995   5th toe   KNEE ARTHROSCOPY Right 12/02/2023   Procedure: RIGHT KNEE ARTHROSCOPY WITH DEBRIDEMENT AND PARTIAL LATERAL MENISCECTOMY AND AN OPEN EXCISION OF A CYST ON MEDIAL ASPECT OF RIGHT KNEE.;  Surgeon: Edie Norleen PARAS, MD;  Location: [facility];  Service: Orthopedics;  Laterality: Right;   LIGAMENT REPAIR Left 03/30/2024   Procedure: REPAIR, LIGAMENT;  Surgeon: Edie Norleen PARAS, MD;  Location: [facility];  Service: Orthopedics;  Laterality: Left;  ULNAR COLLATERAL  LIGAMENT REPAIR OF LEFT THUMB MCP JOINT   Patient Active Problem List   Diagnosis Date Noted   GAD (generalized anxiety disorder) 07/25/2020   MDD (major depressive disorder), recurrent episode, mild (HCC) 07/25/2020   Attention and concentration deficit 07/25/2020   Bilateral carpal tunnel syndrome 05/11/2020    ONSET DATE: 03/30/24  REFERRING DIAG: L thumb UCL repair  THERAPY DIAG:  Stiffness of thumb  joint  Pain in right hand  Stiffness of right wrist, not elsewhere classified  Scar condition and fibrosis of skin  Muscle weakness (generalized)  Rationale for Evaluation and Treatment: Rehabilitation  SUBJECTIVE:   SUBJECTIVE STATEMENT: I think moving my boys into college.  I do not move a lot of the heavy stuff but just sitting a lot-flareup my back.  My back hurts so bad.  Thumbs doing okay.   Pt accompanied by: self  PERTINENT HISTORY: 04/27/24 Ortho visit -  Impression: Rupture of ulnar collateral ligament of left thumb, sequela [S63.642S] Rupture of ulnar collateral ligament of left thumb, sequela (primary encounter diagnosis)  Plan:  1. Treatment options were discussed today with the patient. 2. Cast was removed at today's visit, skin examination demonstrates a well-healing surgical incision along the ulnar aspect of the thumb. 3. The patient was transitioned from the thumb spica cast into a thumb spica to her wrist splint. The patient was instructed to wear this at all times except for showering and she was instructed that she could come out a few times a day to work on gentle wrist motion and gentle flexion extension of her thumb but she was instructed to avoid any gripping or lifting with the left hand. 4. The patient was given a order for occupational therapy to begin working on gentle range of motion to the left wrist,  we will wait to begin working on any strengthening of the thumb until after her next postop appointment. 5. The patient will follow-up with Dr. Edie at her next scheduled visit for repeat evaluation of the thumb and increase activity level. 6. She can contact the clinic if she has any questions, new symptoms develop or symptoms worsen.   PRECAUTIONS: Ortho note for detail    WEIGHT BEARING RESTRICTIONS: NWB L hand  PAIN:  Are you having pain?  1/10 dorsal wrist with flexion  FALLS: Has patient fallen in last 6 months? 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  PATIENT GOALS: I want my thumb pain better my motion and strength so that I can use it.  To grip and pull and push and pinch and pick up and open things  NEXT MD VISIT: 05/13/24  OBJECTIVE:  Note: Objective measures were completed at Evaluation unless otherwise noted.  HAND DOMINANCE: Right  ADLs: Unable to use thumb in any pinching or gripping activities-using mostly fingers and left hand  FUNCTIONAL OUTCOME MEASURES: Next visit  UPPER EXTREMITY ROM:     Active ROM Right eval Left eval L 05/23/24  Shoulder flexion     Shoulder abduction     Shoulder adduction     Shoulder extension     Shoulder internal rotation     Shoulder external rotation     Elbow flexion     Elbow extension     Wrist flexion  50 72  Wrist extension  55 67  Wrist ulnar deviation  34 35  Wrist radial deviation  20 20  Wrist pronation     Wrist supination     (Blank rows = not tested)  Active ROM Right eval Left eval L 05/23/24 L 05/26/24 L 12/04/23  Thumb MCP (0-60) 70 15 30 40 45  Thumb IP (0-80) 65 20 20  35 35  Thumb Radial abd/add (0-55)  NT  50 50   Thumb Palmar abd/add (0-45)  NT  65 65   Thumb Opposition to Small Finger Opposition to distal palmar crease     Opposition to distal fifth  Index MCP (0-90)        Index PIP (0-100)        Index DIP (0-70)         Long MCP (0-90)         Long PIP (0-100)         Long DIP (0-70)         Ring MCP (0-90)         Ring PIP (0-100)         Ring DIP (0-70)         Little MCP (0-90)         Little PIP (0-100)         Little DIP (0-70)         (Blank rows = not tested)  Composite flexion or fisting within normal limits  HAND FUNCTION: 05/30/24 Grip strength: Right: 60 lbs; Left: 18 lbs, Lateral pinch: Right: 20 lbs, Left: 3 lbs, and 3 point pinch: Right: 14 lbs, Left: 3  lbs 06/02/24 Grip strength: Right: 60 lbs; Left: 29 lbs, Lateral pinch: Right: 20 lbs, Left: 5 lbs, and 3 point pinch: Right: 14 lbs, Left: 4 lbs  COORDINATION: Impaired immobilized with thumb  SENSATION:  Patient denies any sensory issues  EDEMA: Edema over the base of thumb.  COGNITION: Overall cognitive status: Within functional limits for tasks assessed  TREATMENT DATE: 06/02/24                                                                                                                            Patient reports more back pain than thumb pain.  Home exercise has been doing good with the putty.  Was not able to get the 16 ounce hammer 1 pound weight for the wrist. Still feels slight pull at the dorsal wrist with wrist flexion Assess patient's grip and prevention strength as well as flexion and opposition of thumb-see flowsheet-great improvement  Modalities: Paraffin Time: 8 Location: Left hand and wrist to decrease stiffness and increased motion   Manual therapy:   Following paraffin pt seen this date for soft tissue massage and mobilizations for carpal and metacarpal spreads, webspace soft tissue and prolonged stretch.   Graston tool #2 done for sweeping over the volar and radial  forearm and hand.  Patient with multiply trigger points and tightness over the radial  forearm and dorsal limiting wrist flexion. Done some dry cupping 5 cm cups done 2 at a time over the radial extensors proximal and distal with a light pressure and static-as well as radial forearm 2 cups with AAROM wrist RD/UD,flexion extension 10 reps.  Repeated 3 times Tolerated well   Therapeutic Exercises:     passive range of motion to blocked thumb IP and MCP flexion 10 reps each.  Pain-free Opposition to 2nd through 5th,   8 reps Patient will focus on opposition to 4th and 5th sliding to second fold of the fourth and distal fold of the fifth after passive range of motion Several times during the day up to  5 times for every 2 hours  Active assisted range of motion for palmar radial abduction 12 reps Continue isometric strength for thumb PA and RA 15 reps- 2 sets and add to HEP  Continue 16 ounce hammer for supination pronation as well as radial ulnar deviation 2 sets of 12 pain-free.  Twice a day For wrist flexion and extension light traction in joint mope's in combination with stretch for flexion extension 12-15 reps 2 times a day 1 set Increase patient to green medium putty for gripping -focus on dropping into the putty with MCP flexion but stop at 2 seconds or 3 seconds should be pain-free  12-15 reps 2 times a day Add 2 continues to lateral pinch but also 3 point pinch making sure long sausage with 3 pinches to decrease resistance x 4 equal to 12 reps pain-free 2 or 3 seconds only. Patient needed mod verbal cueing for correct technique and pain-free    Patient attempting to wean gradually out of the splint keeping pain under 2/10 Patient can do thumb spica 2 hours on 2 hours off, wearing when performing increased task demands continue with contrast  or heat prior to her exercises, discussed working on consistency with contrast min 2 times a day and preferred 3 times, ending with warm prior to exercises.     PATIENT EDUCATION: Education details: findings of eval and HEP  Person educated: Patient Education method: Explanation, Demonstration, Tactile cues, Verbal cues, and Handouts Education comprehension: verbalized understanding, returned demonstration, verbal cues required, and needs further education    GOALS: Goals reviewed with patient? Yes   LONG TERM GOALS: Target date: 8 wks  Patient to be independent in home program to decrease pain to less than a 2/10 at rest. Baseline: No knowledge patient's pain commands 7/10 in thumb and webspace at rest Goal status: INITIAL  2.  Left thumb flexion improved for patient to be able to do opposition to fifth digit symptom-free to initiate  doing buttons and sips Baseline: Right thumb IP flexion 20 and MC flexion 15-resting pain 7/10 Goal status: INITIAL  3.  Left thumb palmar radial abduction active range of motion strength improved for patient to be able to pick up a glass and turn a doorknob symptom-free Baseline:  Goal status: INITIAL  4.  Left thumb fine motor improved for patient to manipulate and retrieve objects from palm to fingertips symptom-free Baseline: Thumb immobilized in thumb spica Goal status: INITIAL  5.  Left t grip) strength improved to within normal range for her age to be able to hold a plate, carry 5 pounds and open to stay symptom-free Baseline: Patient immobilized in thumb spica resting pain 7/10 Goal status: INITIAL  6.  Left wrist active range of motion strength improved to within normal limits for patient to push and pull heavy door, turn doorknob and return to prior level of function symptom-free Baseline: Wrist flexion 50 and extension 55.  With some discomfort and pain Goal status: INITIAL  ASSESSMENT:  CLINICAL IMPRESSION: Patient seen today for occupational therapy  for left thumb ulnar collateral ligament repair by Dr. Edie on 03/30/2024.  Patient presented with prefab thumb spica in place. Pain 5/10 at eval.   Pt cont to make great progress in thumb palmar radial abduction, she was able to make significant progress with thumb MP and IP flexion this date again as well as opposition to all digits.  Encourage patient to continue with 5 times a day working on passive range of motion for thumb IP and MCP flexion followed by opposition to all digits and sliding down 4th and 5th.  Encouraged patient to use her heat.. Scar less tender today and able to tolerate manual massage.  Tolerating strengthening well for the wrist 1 pound weight or 16 ounce hammer -patient was not able to do wrist exercises because of hurting her back l.  As well as isometric strengthening for thumb palmar radial abduction.   Patient upgrade to medium firm putty for gripping and lateral 3-point pinch.  But reinforced technique and reps and sets to stay pain-free.  Pt was busy with helping her sons move to college but plans to focus more on exercises and use of contrast to decrease pain, edema and increase motion.   Patient remains limited in functional use by increased pain decreased motion and strength.  Patient can benefit from skilled OT services to decrease scar tissue and pain and increased motion and strength to return to prior level of function.  PERFORMANCE DEFICITS: in functional skills including ADLs, IADLs, ROM, strength, pain, flexibility, decreased knowledge of use of DME, and UE functional use,   and psychosocial skills  including environmental adaptation and routines and behaviors.   IMPAIRMENTS: are limiting patient from ADLs, IADLs, rest and sleep, play, leisure, and social participation.   COMORBIDITIES: has no other co-morbidities that affects occupational performance. Patient will benefit from skilled OT to address above impairments and improve overall function.  MODIFICATION OR ASSISTANCE TO COMPLETE EVALUATION: No modification of tasks or assist necessary to complete an evaluation.  OT OCCUPATIONAL PROFILE AND HISTORY: Problem focused assessment: Including review of records relating to presenting problem.  CLINICAL DECISION MAKING: LOW - limited treatment options, no task modification necessary  REHAB POTENTIAL: Good for goals  EVALUATION COMPLEXITY: Low   PLAN:  OT FREQUENCY: 1-2x/week  OT DURATION: 8 weeks  PLANNED INTERVENTIONS: 97168 OT Re-evaluation, 97535 self care/ADL training, 02889 therapeutic exercise, 97530 therapeutic activity, 97112 neuromuscular re-education, 97140 manual therapy, 97035 ultrasound, 97018 paraffin, 02960 fluidotherapy, 97034 contrast bath, 97760 Orthotic Initial, [id number] Orthotic/Prosthetic subsequent, scar mobilization, passive range of motion, patient/family  education, and DME and/or AE instructions  CONSULTED AND AGREED WITH PLAN OF CARE: Patient  Ancel Peters, OTR/L,CLT 06/02/2024, 11:53 AM

## 2024-06-06 ENCOUNTER — Ambulatory Visit: Admitting: Occupational Therapy

## 2024-06-06 DIAGNOSIS — M25649 Stiffness of unspecified hand, not elsewhere classified: Secondary | ICD-10-CM | POA: Diagnosis not present

## 2024-06-06 DIAGNOSIS — M6281 Muscle weakness (generalized): Secondary | ICD-10-CM

## 2024-06-06 DIAGNOSIS — M25631 Stiffness of right wrist, not elsewhere classified: Secondary | ICD-10-CM

## 2024-06-06 DIAGNOSIS — L905 Scar conditions and fibrosis of skin: Secondary | ICD-10-CM

## 2024-06-06 DIAGNOSIS — M79641 Pain in right hand: Secondary | ICD-10-CM

## 2024-06-06 NOTE — Therapy (Signed)
 OUTPATIENT OCCUPATIONAL THERAPY ORTHO TREATMENT  Patient Name: Melissa Dixon MRN: [id number] DOB:07-Aug-1986, 38 y.o., female Today's Date: 06/06/2024  PCP: Dr Tobie MART PROVIDER: Kip PA  END OF SESSION:  OT End of Session - 06/06/24 1121     Visit Number 6    Number of Visits 12    Date for OT Re-Evaluation 07/05/24    OT Start Time 1121    Activity Tolerance Patient tolerated treatment well    Behavior During Therapy [facility] for tasks assessed/performed          Past Medical History:  Diagnosis Date   Anxiety    Cervical radiculopathy    Complete tear of ulnar collateral ligament of interphalangeal joint of thumb    Complex tear of lateral meniscus of right knee as current injury, initial encounter    Cyst of knee joint, right    Gestational hypertension    Injury of left thumb    Pain in joints of left hand    Primary osteoarthritis of right knee    Past Surgical History:  Procedure Laterality Date   FRACTURE SURGERY Left 09/1995   5th toe   KNEE ARTHROSCOPY Right 12/02/2023   Procedure: RIGHT KNEE ARTHROSCOPY WITH DEBRIDEMENT AND PARTIAL LATERAL MENISCECTOMY AND AN OPEN EXCISION OF A CYST ON MEDIAL ASPECT OF RIGHT KNEE.;  Surgeon: Edie Norleen PARAS, MD;  Location: [facility] ORS;  Service: Orthopedics;  Laterality: Right;   LIGAMENT REPAIR Left 03/30/2024   Procedure: REPAIR, LIGAMENT;  Surgeon: Edie Norleen PARAS, MD;  Location: [facility] OR[facility];  Service: Orthopedics;  Laterality: Left;  ULNAR COLLATERAL  LIGAMENT REPAIR OF LEFT THUMB MCP JOINT   Patient Active Problem List   Diagnosis Date Noted   GAD (generalized anxiety disorder) 07/25/2020   MDD (major depressive disorder), recurrent episode, mild (HCC) 07/25/2020   Attention and concentration deficit 07/25/2020   Bilateral carpal tunnel syndrome 05/11/2020    ONSET DATE: 03/30/24  REFERRING DIAG: L thumb UCL repair  THERAPY DIAG:  Stiffness of thumb joint  Pain in right hand  Stiffness of right wrist, not  elsewhere classified  Scar condition and fibrosis of skin  Muscle weakness (generalized)  Rationale for Evaluation and Treatment: Rehabilitation  SUBJECTIVE:   SUBJECTIVE STATEMENT: The back is feeling a little better.  Up in doing the thumb exercises like you said few times a day.  And I can touch my pinky now much easier and slide down my ring finger some.  Pain is better. Pt accompanied by: self  PERTINENT HISTORY: 04/27/24 Ortho visit -  Impression: Rupture of ulnar collateral ligament of left thumb, sequela [S63.642S] Rupture of ulnar collateral ligament of left thumb, sequela (primary encounter diagnosis)  Plan:  1. Treatment options were discussed today with the patient. 2. Cast was removed at today's visit, skin examination demonstrates a well-healing surgical incision along the ulnar aspect of the thumb. 3. The patient was transitioned from the thumb spica cast into a thumb spica to her wrist splint. The patient was instructed to wear this at all times except for showering and she was instructed that she could come out a few times a day to work on gentle wrist motion and gentle flexion extension of her thumb but she was instructed to avoid any gripping or lifting with the left hand. 4. The patient was given a order for occupational therapy to begin working on gentle range of motion to the left wrist, we will wait to begin working on any strengthening of the  thumb until after her next postop appointment. 5. The patient will follow-up with Dr. Edie at her next scheduled visit for repeat evaluation of the thumb and increase activity level. 6. She can contact the clinic if she has any questions, new symptoms develop or symptoms worsen.   PRECAUTIONS: Ortho note for detail    WEIGHT BEARING RESTRICTIONS: NWB L hand  PAIN:  Are you having pain?  1/10 dorsal wrist with flexion  FALLS: Has patient fallen in last 6 months? 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  PATIENT GOALS: I want my thumb pain better my motion and strength so that I can use it.  To grip and pull and push and pinch and pick up and open things  NEXT MD VISIT: 05/13/24  OBJECTIVE:  Note: Objective measures were completed at Evaluation unless otherwise noted.  HAND DOMINANCE: Right  ADLs: Unable to use thumb in any pinching or gripping activities-using mostly fingers and left hand  FUNCTIONAL OUTCOME MEASURES: Next visit  UPPER EXTREMITY ROM:     Active ROM Right eval Left eval L 05/23/24  Shoulder flexion     Shoulder abduction     Shoulder adduction     Shoulder extension     Shoulder internal rotation     Shoulder external rotation     Elbow flexion     Elbow extension     Wrist flexion  50 72  Wrist extension  55 67  Wrist ulnar deviation  34 35  Wrist radial deviation  20 20  Wrist pronation     Wrist supination     (Blank rows = not tested)  Active ROM Right eval Left eval L 05/23/24 L 05/26/24 L 06/02/24 L 06/06/24   Thumb MCP (0-60) 70 15 30 40 45 50  Thumb IP (0-80) 65 20 20  35 35 40  Thumb Radial abd/add (0-55)  NT  50 50    Thumb Palmar abd/add (0-45)  NT  65 65    Thumb Opposition to Small Finger Opposition to distal palmar crease     Opposition to distal fifth   Index MCP (0-90)         Index PIP (0-100)         Index DIP (0-70)          Long MCP (0-90)          Long PIP (0-100)          Long DIP (0-70)          Ring MCP (0-90)          Ring PIP (0-100)          Ring DIP (0-70)          Little MCP (0-90)          Little PIP (0-100)          Little DIP (0-70)          (Blank rows = not tested)  Composite flexion or fisting within normal limits  HAND FUNCTION: 05/30/24 Grip strength: Right: 60 lbs; Left: 18 lbs, Lateral pinch: Right: 20 lbs, Left: 3 lbs, and 3 point pinch: Right: 14 lbs, Left: 3 lbs 06/02/24 Grip  strength: Right: 60 lbs; Left: 29 lbs, Lateral pinch: Right: 20 lbs, Left: 5 lbs, and 3 point pinch: Right: 14 lbs, Left: 4  lbs 06/06/24 Grip strength: Right: 60 lbs; Left: 32 lbs, Lateral pinch: Right: 20 lbs, Left: 8 lbs, and 3 point pinch: Right: 14 lbs, Left: 5 lbs  COORDINATION:  COORDINATION: Impaired immobilized with thumb  SENSATION: Patient denies any sensory issues  EDEMA: Edema over the base of thumb.  COGNITION: Overall cognitive status: Within functional limits for tasks assessed  TREATMENT DATE: 06/06/24                                                                                                                            Patient reports her back feels better.   Has been doing the thumb stretches and opposition several times during the day.  With great improvement.   Less pain in the thumb.   Has been doing 16 ounce hammer for wrist with no pain.   Still feels slight pull at the dorsal wrist with wrist flexion-patient continues to have trigger points and increased tightness in the fascia on the dorsal wrist and forearm Assess patient's grip and prevention strength as well as flexion and opposition of thumb-see flowsheet-continue show  improvement   Patient had to leave earlier had a meeting.  Did not do paraffin Manual therapy:   seen this date for soft tissue massage and mobilizations for carpal and metacarpal spreads, webspace soft tissue and prolonged stretch.   Graston tool #2 done for sweeping over the volar and radial  forearm and hand.  Patient with multiply trigger points and tightness over the radial  forearm and dorsal limiting wrist flexion. Done dry cupping 5 cm cups done 2 at a time over the radial extensors proximal and distal with a light pressure and static-as well as radial forearm 2 cups with AAROM wrist RD/UD,flexion extension 10 reps.  Repeat last time 3rd cup on palm too -  Repeated 3 times Tolerated well   Therapeutic Exercises:     passive  range of motion to blocked thumb IP and MCP flexion 10 reps each.  Pain-free Passive range of motion for composite flexion to second fold for 4th and 5th.  10 reps Opposition to 2nd through 5th,   8 reps Patient will focus on opposition to 4th and 5th sliding to second fold of the fourth and  2nd fold of the fifth after passive range of motion Continue several times during the day up to 5 times for every 2 hours  Active assisted range of motion for palmar radial abduction 12 reps Rubber band for palmar radial adduction 12 reps Continue 16 ounce hammer for supination pronation as well as radial ulnar deviation upgrade to 3 sets of 12 pain-free.  Twice a day For wrist flexion and extension light traction in joint mobs in combination with stretch for flexion extension 12-15 reps 2 times a day 1 set green medium putty for gripping -focus on dropping into the putty with MCP flexion but stop at 2 seconds or 3 seconds needed mod verbal cueing for composite and  MC flexion focus on  Increase to 2 sets 12-15 reps 2 times a day Lateral pinch and 3 point pinch making sure long sausage with 3 pinches - 12 reps pain-free 2 or 3 seconds only. 2nd set Patient needed mod verbal cueing for correct technique and pain-free    Patient attempting to wean gradually out of the splint keeping pain under 2/10 Patient report has been out of her splint most of the time.  Except with heavy activities continue with contrast or heat prior to her exercises, discussed working on consistency with contrast min 2 times a day and preferred 3 times, ending with warm prior to exercises.     PATIENT EDUCATION: Education details: findings of eval and HEP  Person educated: Patient Education method: Explanation, Demonstration, Tactile cues, Verbal cues, and Handouts Education comprehension: verbalized understanding, returned demonstration, verbal cues required, and needs further education    GOALS: Goals reviewed with patient?  Yes   LONG TERM GOALS: Target date: 8 wks  Patient to be independent in home program to decrease pain to less than a 2/10 at rest. Baseline: No knowledge patient's pain commands 7/10 in thumb and webspace at rest Goal status: INITIAL  2.  Left thumb flexion improved for patient to be able to do opposition to fifth digit symptom-free to initiate doing buttons and sips Baseline: Right thumb IP flexion 20 and MC flexion 15-resting pain 7/10 Goal status: INITIAL  3.  Left thumb palmar radial abduction active range of motion strength improved for patient to be able to pick up a glass and turn a doorknob symptom-free Baseline:  Goal status: INITIAL  4.  Left thumb fine motor improved for patient to manipulate and retrieve objects from palm to fingertips symptom-free Baseline: Thumb immobilized in thumb spica Goal status: INITIAL  5.  Left t grip) strength improved to within normal range for her age to be able to hold a plate, carry 5 pounds and open to stay symptom-free Baseline: Patient immobilized in thumb spica resting pain 7/10 Goal status: INITIAL  6.  Left wrist active range of motion strength improved to within normal limits for patient to push and pull heavy door, turn doorknob and return to prior level of function symptom-free Baseline: Wrist flexion 50 and extension 55.  With some discomfort and pain Goal status: INITIAL  ASSESSMENT:  CLINICAL IMPRESSION: Patient seen today for occupational therapy  for left thumb ulnar collateral ligament repair by Dr. Edie on 03/30/2024.  Patient presented with prefab thumb spica in place. Pain 5/10 at eval.   Pt cont to make great progress in thumb palmar radial abduction, as well as thumb MP and IP flexion-and opposition to 4th and 5th and able to slide down to distal and second fold.  Remind patient to continue with 5 times a day working on passive range of motion for thumb IP and MCP flexion followed by opposition to all digits and sliding  down 4th and 5th.  Encouraged patient to use her heat. Scar less tender today and able to tolerate manual massage.  Tolerating strengthening well for the wrist 1 pound weight or 16 ounce hammer -increase sets as well as rubber band for thumb palmar radial abduction.  Patient to continue with medium firm putty for gripping, lateral and 3-point pinch.  But reinforced technique and reps and sets to stay pain-free.  Pt was busy with helping her sons move to college but plans to focus more on exercises and use of contrast to decrease pain,  edema and increase motion.   Patient remains limited in functional use by increased pain decreased motion and strength.  Patient can benefit from skilled OT services to decrease scar tissue and pain and increased motion and strength to return to prior level of function.  PERFORMANCE DEFICITS: in functional skills including ADLs, IADLs, ROM, strength, pain, flexibility, decreased knowledge of use of DME, and UE functional use,   and psychosocial skills including environmental adaptation and routines and behaviors.   IMPAIRMENTS: are limiting patient from ADLs, IADLs, rest and sleep, play, leisure, and social participation.   COMORBIDITIES: has no other co-morbidities that affects occupational performance. Patient will benefit from skilled OT to address above impairments and improve overall function.  MODIFICATION OR ASSISTANCE TO COMPLETE EVALUATION: No modification of tasks or assist necessary to complete an evaluation.  OT OCCUPATIONAL PROFILE AND HISTORY: Problem focused assessment: Including review of records relating to presenting problem.  CLINICAL DECISION MAKING: LOW - limited treatment options, no task modification necessary  REHAB POTENTIAL: Good for goals  EVALUATION COMPLEXITY: Low   PLAN:  OT FREQUENCY: 1-2x/week  OT DURATION: 8 weeks  PLANNED INTERVENTIONS: 97168 OT Re-evaluation, 97535 self care/ADL training, 02889 therapeutic exercise, 97530  therapeutic activity, 97112 neuromuscular re-education, 97140 manual therapy, 97035 ultrasound, 97018 paraffin, 02960 fluidotherapy, 97034 contrast bath, 97760 Orthotic Initial, [id number] Orthotic/Prosthetic subsequent, scar mobilization, passive range of motion, patient/family education, and DME and/or AE instructions  CONSULTED AND AGREED WITH PLAN OF CARE: Patient  Ancel Peters, OTR/L,CLT 06/06/2024, 11:21 AM

## 2024-06-10 ENCOUNTER — Ambulatory Visit: Admitting: Occupational Therapy

## 2024-06-10 DIAGNOSIS — M25649 Stiffness of unspecified hand, not elsewhere classified: Secondary | ICD-10-CM

## 2024-06-10 DIAGNOSIS — M6281 Muscle weakness (generalized): Secondary | ICD-10-CM

## 2024-06-10 DIAGNOSIS — L905 Scar conditions and fibrosis of skin: Secondary | ICD-10-CM

## 2024-06-10 DIAGNOSIS — M25631 Stiffness of right wrist, not elsewhere classified: Secondary | ICD-10-CM

## 2024-06-10 DIAGNOSIS — M79641 Pain in right hand: Secondary | ICD-10-CM

## 2024-06-10 NOTE — Therapy (Signed)
 OUTPATIENT OCCUPATIONAL THERAPY ORTHO TREATMENT  Patient Name: Melissa Dixon MRN: [id number] DOB:[birth year], 38 y.o., female Today's Date: 06/10/2024  PCP: Dr Tobie MART PROVIDER: Kip PA  END OF SESSION:  OT End of Session - 06/10/24 1119     Visit Number 7    Number of Visits 12    Date for OT Re-Evaluation 07/05/24    OT Start Time 1119    OT Stop Time 1201    OT Time Calculation (min) 42 min    Activity Tolerance Patient tolerated treatment well    Behavior During Therapy WFL for tasks assessed/performed          Past Medical History:  Diagnosis Date   Anxiety    Cervical radiculopathy    Complete tear of ulnar collateral ligament of interphalangeal joint of thumb    Complex tear of lateral meniscus of right knee as current injury, initial encounter    Cyst of knee joint, right    Gestational hypertension    Injury of left thumb    Pain in joints of left hand    Primary osteoarthritis of right knee    Past Surgical History:  Procedure Laterality Date   FRACTURE SURGERY Left 09/1995   5th toe   KNEE ARTHROSCOPY Right 12/02/2023   Procedure: RIGHT KNEE ARTHROSCOPY WITH DEBRIDEMENT AND PARTIAL LATERAL MENISCECTOMY AND AN OPEN EXCISION OF A CYST ON MEDIAL ASPECT OF RIGHT KNEE.;  Surgeon: Edie Norleen PARAS, MD;  Location: [facility];  Service: Orthopedics;  Laterality: Right;   LIGAMENT REPAIR Left 03/30/2024   Procedure: REPAIR, LIGAMENT;  Surgeon: Edie Norleen PARAS, MD;  Location: [facility];  Service: Orthopedics;  Laterality: Left;  ULNAR COLLATERAL  LIGAMENT REPAIR OF LEFT THUMB MCP JOINT   Patient Active Problem List   Diagnosis Date Noted   GAD (generalized anxiety disorder) 07/25/2020   MDD (major depressive disorder), recurrent episode, mild (HCC) 07/25/2020   Attention and concentration deficit 07/25/2020   Bilateral carpal tunnel syndrome 05/11/2020    ONSET DATE: 03/30/24  REFERRING DIAG: L thumb UCL repair  THERAPY DIAG:  Stiffness of thumb  joint  Pain in right hand  Stiffness of right wrist, not elsewhere classified  Scar condition and fibrosis of skin  Muscle weakness (generalized)  Rationale for Evaluation and Treatment: Rehabilitation  SUBJECTIVE:   SUBJECTIVE STATEMENT: I am doing okay.  I can get to the second fold of my ring finger with my thumb.  And the distal fold on my pinky.  My thumb just feels tight.  And weak.  Doing okay with the putty  pt accompanied by: self  PERTINENT HISTORY: 04/27/24 Ortho visit -  Impression: Rupture of ulnar collateral ligament of left thumb, sequela [S63.642S] Rupture of ulnar collateral ligament of left thumb, sequela (primary encounter diagnosis)  Plan:  1. Treatment options were discussed today with the patient. 2. Cast was removed at today's visit, skin examination demonstrates a well-healing surgical incision along the ulnar aspect of the thumb. 3. The patient was transitioned from the thumb spica cast into a thumb spica to her wrist splint. The patient was instructed to wear this at all times except for showering and she was instructed that she could come out a few times a day to work on gentle wrist motion and gentle flexion extension of her thumb but she was instructed to avoid any gripping or lifting with the left hand. 4. The patient was given a order for occupational therapy to begin working on gentle range  of motion to the left wrist, we will wait to begin working on any strengthening of the thumb until after her next postop appointment. 5. The patient will follow-up with Dr. Edie at her next scheduled visit for repeat evaluation of the thumb and increase activity level. 6. She can contact the clinic if she has any questions, new symptoms develop or symptoms worsen.   PRECAUTIONS: Ortho note for detail    WEIGHT BEARING RESTRICTIONS: NWB L hand  PAIN:  Are you having pain?  More tightness than pain  FALLS: Has patient fallen in last 6 months? 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  PATIENT GOALS: I want my thumb pain better my motion and strength so that I can use it.  To grip and pull and push and pinch and pick up and open things  NEXT MD VISIT: 05/13/24  OBJECTIVE:  Note: Objective measures were completed at Evaluation unless otherwise noted.  HAND DOMINANCE: Right  ADLs: Unable to use thumb in any pinching or gripping activities-using mostly fingers and left hand  FUNCTIONAL OUTCOME MEASURES: Next visit  UPPER EXTREMITY ROM:     Active ROM Right eval Left eval L 05/23/24  Shoulder flexion     Shoulder abduction     Shoulder adduction     Shoulder extension     Shoulder internal rotation     Shoulder external rotation     Elbow flexion     Elbow extension     Wrist flexion  50 72  Wrist extension  55 67  Wrist ulnar deviation  34 35  Wrist radial deviation  20 20  Wrist pronation     Wrist supination     (Blank rows = not tested)  Active ROM Right eval Left eval L 05/23/24 L 05/26/24 L 06/02/24 L 06/06/24  L 06/10/24  Thumb MCP (0-60) 70 15 30 40 45 50 55  Thumb IP (0-80) 65 20 20  35 35 40 45  Thumb Radial abd/add (0-55)  NT  50 50     Thumb Palmar abd/add (0-45)  NT  65 65     Thumb Opposition to Small Finger Opposition to distal palmar crease     Opposition to distal fifth  Opposition to distal fold of fifth  Index MCP (0-90)          Index PIP (0-100)          Index DIP (0-70)           Long MCP (0-90)           Long PIP (0-100)           Long DIP (0-70)           Ring MCP (0-90)           Ring PIP (0-100)           Ring DIP (0-70)           Little MCP (0-90)           Little PIP (0-100)           Little DIP (0-70)           (Blank rows = not tested)  Composite flexion or fisting within normal limits  HAND FUNCTION: 05/30/24 Grip strength: Right: 60 lbs; Left: 18  lbs, Lateral pinch: Right: 20  lbs, Left: 3 lbs, and 3 point pinch: Right: 14 lbs, Left: 3 lbs 06/02/24 Grip strength: Right: 60 lbs; Left: 29 lbs, Lateral pinch: Right: 20 lbs, Left: 5 lbs, and 3 point pinch: Right: 14 lbs, Left: 4 lbs 06/06/24 Grip strength: Right: 60 lbs; Left: 32 lbs, Lateral pinch: Right: 20 lbs, Left: 8 lbs, and 3 point pinch: Right: 14 lbs, Left: 5 lbs 06/10/24 Grip strength: Right: 60 lbs; Left: 32 lbs, Lateral pinch: Right: 20 lbs, Left: 9 lbs, and 3 point pinch: Right: 14 lbs, Left: 6 lbs COORDINATION:  COORDINATION: Impaired immobilized with thumb  SENSATION: Patient denies any sensory issues  EDEMA: Edema over the base of thumb.  COGNITION: Overall cognitive status: Within functional limits for tasks assessed  TREATMENT DATE: 06/10/24                                                                                                                             Has been doing the thumb stretches and opposition several times during the day.  With great improvement.   Less pain in the thumb.   Has been doing 16 ounce hammer for wrist with no pain.   Says wrist flexion as well as strength in all planes.  Guarding still thumb.  4+/5 for the wrist Weak and guarding with lateral loading of the thumb with Opposition improving endrange improving.   Paraffin done 8 minutes with a Coban flexion wrap with thumb opposition to increase flexion prior to home exercises and range of motion   Manual therapy:   seen this date for soft tissue massage and mobilizations for carpal and metacarpal spreads, webspace soft tissue and prolonged stretch.   Graston tool #2 done for sweeping over the volar and radial  forearm and hand.  Patient with multiply trigger points and tightness over the radial  forearm and dorsal limiting wrist flexion. Done dry cupping 5 cm cups done 2 at a time over the radial extensors proximal and distal with a light pressure and static-as well as radial forearm 2  cups with AAROM wrist RD/UD,flexion extension 10 reps.  Repeat last time 3rd cup on palm too -  Repeated 3 times Tolerated well   Therapeutic Exercises:     Passive range of motion for composite flexion to second fold for 4th and 5th.  10 reps Opposition to 2nd through 5th,   8 reps Patient will focus on opposition to 4th and 5th sliding  2nd fold of the fifth after passive range of motion Continue several times during the day up to 5 times for every 2 hours  Active assisted range of motion for palmar radial abduction 12 reps Rubber band for palmar radial adduction 12 reps at Upgrade to 2 pounds for supination pronation as well as radial ulnar deviation and wrist flexion extension 1 set 12 pain-free.  Twice a day For rubber band and 2 pound weight patient can increase to a second set in 3 days  and 3 sets in 5 to 6 days if pain-free  green medium putty for gripping -focus on dropping into the putty with MCP flexion but stop at 2 seconds or 3 seconds needed mod verbal cueing for composite and MC flexion focus on  2 sets 12-15 reps 2 times a day Lateral pinch and 3 point pinch 2 sets of 12 reps pain-free 2 or 3 seconds only.  Into small sausage or ball 2 sets can increase to a third set in 3 days At pulling putting maintaining opposition and oval at thumb webspace  Patient needed mod verbal cueing for correct technique and pain-free       PATIENT EDUCATION: Education details: findings of eval and HEP  Person educated: Patient Education method: Explanation, Demonstration, Tactile cues, Verbal cues, and Handouts Education comprehension: verbalized understanding, returned demonstration, verbal cues required, and needs further education    GOALS: Goals reviewed with patient? Yes   LONG TERM GOALS: Target date: 8 wks  Patient to be independent in home program to decrease pain to less than a 2/10 at rest. Baseline: No knowledge patient's pain commands 7/10 in thumb and webspace at  rest Goal status: INITIAL  2.  Left thumb flexion improved for patient to be able to do opposition to fifth digit symptom-free to initiate doing buttons and sips Baseline: Right thumb IP flexion 20 and MC flexion 15-resting pain 7/10 Goal status: INITIAL  3.  Left thumb palmar radial abduction active range of motion strength improved for patient to be able to pick up a glass and turn a doorknob symptom-free Baseline:  Goal status: INITIAL  4.  Left thumb fine motor improved for patient to manipulate and retrieve objects from palm to fingertips symptom-free Baseline: Thumb immobilized in thumb spica Goal status: INITIAL  5.  Left t grip) strength improved to within normal range for her age to be able to hold a plate, carry 5 pounds and open to stay symptom-free Baseline: Patient immobilized in thumb spica resting pain 7/10 Goal status: INITIAL  6.  Left wrist active range of motion strength improved to within normal limits for patient to push and pull heavy door, turn doorknob and return to prior level of function symptom-free Baseline: Wrist flexion 50 and extension 55.  With some discomfort and pain Goal status: INITIAL  ASSESSMENT:  CLINICAL IMPRESSION: Patient seen today for occupational therapy  for left thumb ulnar collateral ligament repair by Dr. Edie on 03/30/2024.  Patient presented with prefab thumb spica in place. Pain 5/10 at eval.   Pt cont to make great progress in thumb palmar radial abduction, as well as thumb MP and IP flexion-and opposition to 4th and 5th and able to slide down to distal and second fold.  Remind patient to continue with 5 times a day working on passive range of motion for thumb IP and MCP flexion followed by opposition to all digits and sliding down 4th and 5th.  Encouraged patient to use her heat. Scar less tender today and able to tolerate manual massage.  Able to upgrade patient to 2 pound weight for wrist in all planes as well as rubber band to  continue with palmar radial abduction can increase to a second set and a third set over the next week if pain-free.  Patient to continue with medium firm putty for gripping, lateral and 3-point pinch.  Actamin to putty but reinforced technique and reps and sets to stay pain-free.  Pt was busy with helping her sons move  to college but plans to focus more on exercises and use of contrast to decrease pain, edema and increase motion.   Patient remains limited in functional use by increased pain decreased motion and strength.  Patient can benefit from skilled OT services to decrease scar tissue and pain and increased motion and strength to return to prior level of function.  PERFORMANCE DEFICITS: in functional skills including ADLs, IADLs, ROM, strength, pain, flexibility, decreased knowledge of use of DME, and UE functional use,   and psychosocial skills including environmental adaptation and routines and behaviors.   IMPAIRMENTS: are limiting patient from ADLs, IADLs, rest and sleep, play, leisure, and social participation.   COMORBIDITIES: has no other co-morbidities that affects occupational performance. Patient will benefit from skilled OT to address above impairments and improve overall function.  MODIFICATION OR ASSISTANCE TO COMPLETE EVALUATION: No modification of tasks or assist necessary to complete an evaluation.  OT OCCUPATIONAL PROFILE AND HISTORY: Problem focused assessment: Including review of records relating to presenting problem.  CLINICAL DECISION MAKING: LOW - limited treatment options, no task modification necessary  REHAB POTENTIAL: Good for goals  EVALUATION COMPLEXITY: Low   PLAN:  OT FREQUENCY: 1-2x/week  OT DURATION: 8 weeks  PLANNED INTERVENTIONS: 97168 OT Re-evaluation, 97535 self care/ADL training, 02889 therapeutic exercise, 97530 therapeutic activity, 97112 neuromuscular re-education, 97140 manual therapy, 97035 ultrasound, 97018 paraffin, 02960 fluidotherapy, 97034  contrast bath, 97760 Orthotic Initial, [id number] Orthotic/Prosthetic subsequent, scar mobilization, passive range of motion, patient/family education, and DME and/or AE instructions  CONSULTED AND AGREED WITH PLAN OF CARE: Patient  Ancel Peters, OTR/L,CLT 06/10/2024, 12:52 PM

## 2024-06-20 ENCOUNTER — Ambulatory Visit: Admitting: Occupational Therapy

## 2024-06-28 ENCOUNTER — Ambulatory Visit: Attending: Student | Admitting: Occupational Therapy

## 2024-06-28 DIAGNOSIS — M25631 Stiffness of right wrist, not elsewhere classified: Secondary | ICD-10-CM | POA: Insufficient documentation

## 2024-06-28 DIAGNOSIS — M25649 Stiffness of unspecified hand, not elsewhere classified: Secondary | ICD-10-CM | POA: Insufficient documentation

## 2024-06-28 DIAGNOSIS — L905 Scar conditions and fibrosis of skin: Secondary | ICD-10-CM | POA: Insufficient documentation

## 2024-06-28 DIAGNOSIS — M6281 Muscle weakness (generalized): Secondary | ICD-10-CM | POA: Diagnosis present

## 2024-06-28 DIAGNOSIS — M79641 Pain in right hand: Secondary | ICD-10-CM | POA: Insufficient documentation

## 2024-06-28 NOTE — Therapy (Signed)
 OUTPATIENT OCCUPATIONAL THERAPY ORTHO TREATMENT  Patient Name: Melissa Dixon MRN: [id number] DOB:[birth year], 38 y.o., female Today's Date: 06/28/2024  PCP: Dr Tobie MART PROVIDER: Kip PA  END OF SESSION:  OT End of Session - 06/28/24 0823     Visit Number 8    Number of Visits 12    Date for OT Re-Evaluation 07/05/24    OT Start Time 0820    OT Stop Time 0900    OT Time Calculation (min) 40 min    Activity Tolerance Patient tolerated treatment well    Behavior During Therapy [facility] for tasks assessed/performed          Past Medical History:  Diagnosis Date   Anxiety    Cervical radiculopathy    Complete tear of ulnar collateral ligament of interphalangeal joint of thumb    Complex tear of lateral meniscus of right knee as current injury, initial encounter    Cyst of knee joint, right    Gestational hypertension    Injury of left thumb    Pain in joints of left hand    Primary osteoarthritis of right knee    Past Surgical History:  Procedure Laterality Date   FRACTURE SURGERY Left 09/1995   5th toe   KNEE ARTHROSCOPY Right 12/02/2023   Procedure: RIGHT KNEE ARTHROSCOPY WITH DEBRIDEMENT AND PARTIAL LATERAL MENISCECTOMY AND AN OPEN EXCISION OF A CYST ON MEDIAL ASPECT OF RIGHT KNEE.;  Surgeon: Edie Norleen PARAS, MD;  Location: [facility] ORS;  Service: Orthopedics;  Laterality: Right;   LIGAMENT REPAIR Left 03/30/2024   Procedure: REPAIR, LIGAMENT;  Surgeon: Edie Norleen PARAS, MD;  Location: [facility] OR[facility];  Service: Orthopedics;  Laterality: Left;  ULNAR COLLATERAL  LIGAMENT REPAIR OF LEFT THUMB MCP JOINT   Patient Active Problem List   Diagnosis Date Noted   GAD (generalized anxiety disorder) 07/25/2020   MDD (major depressive disorder), recurrent episode, mild (HCC) 07/25/2020   Attention and concentration deficit 07/25/2020   Bilateral carpal tunnel syndrome 05/11/2020    ONSET DATE: 03/30/24  REFERRING DIAG: L thumb UCL repair  THERAPY DIAG:  Stiffness of thumb  joint  Pain in right hand  Stiffness of right wrist, not elsewhere classified  Scar condition and fibrosis of skin  Muscle weakness (generalized)  Rationale for Evaluation and Treatment: Rehabilitation  SUBJECTIVE:   SUBJECTIVE STATEMENT: My thumb is getting better.  I can get close to the base of my pinky of my thumb.  Doing good with the putty.  Using it more normally.  But still favoring it with heavy stuff. pt accompanied by: self  PERTINENT HISTORY: 04/27/24 Ortho visit -  Impression: Rupture of ulnar collateral ligament of left thumb, sequela [S63.642S] Rupture of ulnar collateral ligament of left thumb, sequela (primary encounter diagnosis)  Plan:  1. Treatment options were discussed today with the patient. 2. Cast was removed at today's visit, skin examination demonstrates a well-healing surgical incision along the ulnar aspect of the thumb. 3. The patient was transitioned from the thumb spica cast into a thumb spica to her wrist splint. The patient was instructed to wear this at all times except for showering and she was instructed that she could come out a few times a day to work on gentle wrist motion and gentle flexion extension of her thumb but she was instructed to avoid any gripping or lifting with the left hand. 4. The patient was given a order for occupational therapy to begin working on gentle range of motion to the left  wrist, we will wait to begin working on any strengthening of the thumb until after her next postop appointment. 5. The patient will follow-up with Dr. Edie at her next scheduled visit for repeat evaluation of the thumb and increase activity level. 6. She can contact the clinic if she has any questions, new symptoms develop or symptoms worsen.   PRECAUTIONS: Ortho note for detail    WEIGHT BEARING RESTRICTIONS: NWB L hand  PAIN:  Are you having pain?  More tightness than pain  FALLS: Has patient fallen in last 6 months? 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  PATIENT GOALS: I want my thumb pain better my motion and strength so that I can use it.  To grip and pull and push and pinch and pick up and open things  NEXT MD VISIT: 05/13/24  OBJECTIVE:  Note: Objective measures were completed at Evaluation unless otherwise noted.  HAND DOMINANCE: Right  ADLs: Unable to use thumb in any pinching or gripping activities-using mostly fingers and left hand  FUNCTIONAL OUTCOME MEASURES: Next visit  UPPER EXTREMITY ROM:     Active ROM Right eval Left eval L 05/23/24  Shoulder flexion     Shoulder abduction     Shoulder adduction     Shoulder extension     Shoulder internal rotation     Shoulder external rotation     Elbow flexion     Elbow extension     Wrist flexion  50 72  Wrist extension  55 67  Wrist ulnar deviation  34 35  Wrist radial deviation  20 20  Wrist pronation     Wrist supination     (Blank rows = not tested)  Active ROM Right eval Left eval L 05/23/24 L 05/26/24 L 06/02/24 L 06/06/24  L 06/10/24 L 06/28/24  Thumb MCP (0-60) 70 15 30 40 45 50 55 50  Thumb IP (0-80) 65 20 20  35 35 40 45 45  Thumb Radial abd/add (0-55)  NT  50 50      Thumb Palmar abd/add (0-45)  NT  65 65      Thumb Opposition to Small Finger Opposition to distal palmar crease     Opposition to distal fifth  Opposition to distal fold of fifth Opposition to second fold of fifth  Index MCP (0-90)           Index PIP (0-100)           Index DIP (0-70)            Long MCP (0-90)            Long PIP (0-100)            Long DIP (0-70)            Ring MCP (0-90)            Ring PIP (0-100)            Ring DIP (0-70)            Little MCP (0-90)            Little PIP (0-100)            Little DIP (0-70)            (Blank rows = not tested)  Composite flexion or fisting within  normal  limits  HAND FUNCTION: 05/30/24 Grip strength: Right: 60 lbs; Left: 18 lbs, Lateral pinch: Right: 20 lbs, Left: 3 lbs, and 3 point pinch: Right: 14 lbs, Left: 3 lbs 06/02/24 Grip strength: Right: 60 lbs; Left: 29 lbs, Lateral pinch: Right: 20 lbs, Left: 5 lbs, and 3 point pinch: Right: 14 lbs, Left: 4 lbs 06/06/24 Grip strength: Right: 60 lbs; Left: 32 lbs, Lateral pinch: Right: 20 lbs, Left: 8 lbs, and 3 point pinch: Right: 14 lbs, Left: 5 lbs 06/10/24 Grip strength: Right: 60 lbs; Left: 32 lbs, Lateral pinch: Right: 20 lbs, Left: 9 lbs, and 3 point pinch: Right: 14 lbs, Left: 6 lbs 06/28/24 Grip strength: Right: 60 lbs; Left: 40 lbs, Lateral pinch: Right: 20 lbs, Left: 11 lbs, and 3 point pinch: Right: 14 lbs, Left: 10 lbs COORDINATION:  COORDINATION: Impaired immobilized with thumb  SENSATION: Patient denies any sensory issues  EDEMA: Edema over the base of thumb.  COGNITION: Overall cognitive status: Within functional limits for tasks assessed  TREATMENT DATE: 06/28/24                                                                                                                             Has been doing the thumb stretches and opposition several times during the day.  With great improvement.   Opposition to second fold of fifth. Less pain in the thumb.   Less guarding but still favoring it with heavy activities.  Paraffin done 8 minutes with a Coban flexion wrap with thumb opposition to increase flexion prior to home exercises and range of motion   Manual therapy:   seen this date for soft tissue massage and mobilizations for carpal and metacarpal spreads, webspace soft tissue and prolonged stretch.   Graston tool #2 done for sweeping over the volar and radial  forearm and hand.   Therapeutic Exercises:     Passive range of motion for composite flexion to second fold for 4th and 5th.  10 reps Opposition to 2nd through 5th,   8 reps Patient will focus on opposition to 4th and  5th sliding  2nd fold of the fifth after passive range of motion Continue several times during the day up to 5 times for every 2 hours   Green med firm putty with teal - for larger ball - use 1/2 or 2/3 of  putty Gripping  1 set 15 reps   2 times a day Pulling and twisting with all digits - 12 reps  Lat and 3 point pinch - 15 reps 2 x day - increase gradually over the next week if pain free - 2nd and then 3rd set   Patient needed min v/c and A to do correct        PATIENT EDUCATION: Education details: findings of eval and HEP  Person educated: Patient Education method: Explanation, Demonstration, Tactile cues, Verbal cues, and Handouts Education comprehension: verbalized understanding, returned demonstration, verbal cues required, and  needs further education    GOALS: Goals reviewed with patient? Yes   LONG TERM GOALS: Target date: 8 wks  Patient to be independent in home program to decrease pain to less than a 2/10 at rest. Baseline: No knowledge patient's pain commands 7/10 in thumb and webspace at rest Goal status: INITIAL  2.  Left thumb flexion improved for patient to be able to do opposition to fifth digit symptom-free to initiate doing buttons and sips Baseline: Right thumb IP flexion 20 and MC flexion 15-resting pain 7/10 Goal status: INITIAL  3.  Left thumb palmar radial abduction active range of motion strength improved for patient to be able to pick up a glass and turn a doorknob symptom-free Baseline:  Goal status: INITIAL  4.  Left thumb fine motor improved for patient to manipulate and retrieve objects from palm to fingertips symptom-free Baseline: Thumb immobilized in thumb spica Goal status: INITIAL  5.  Left t grip) strength improved to within normal range for her age to be able to hold a plate, carry 5 pounds and open to stay symptom-free Baseline: Patient immobilized in thumb spica resting pain 7/10 Goal status: INITIAL  6.  Left wrist active range  of motion strength improved to within normal limits for patient to push and pull heavy door, turn doorknob and return to prior level of function symptom-free Baseline: Wrist flexion 50 and extension 55.  With some discomfort and pain Goal status: INITIAL  ASSESSMENT:  CLINICAL IMPRESSION: Patient seen today for occupational therapy  for left thumb ulnar collateral ligament repair by Dr. Edie on 03/30/2024.  Patient presented with prefab thumb spica in place. Pain 5/10 at eval.   Pt cont to make great progress in thumb palmar radial abduction, as well as thumb MP and IP flexion-and opposition to 4th and 5th and able to slide down to  second fold. Pt  cont to make progress in ROM and strength - grip and prehension - but to focus on IP flexion prior to opposition and then putty HEP - gradually increase reps and sets  this week PAIN free- using hand much more except heavy objects -   Patient remains limited in functional use by increased pain decreased motion and strength.  Patient can benefit from skilled OT services to decrease scar tissue and pain and increased motion and strength to return to prior level of function.  PERFORMANCE DEFICITS: in functional skills including ADLs, IADLs, ROM, strength, pain, flexibility, decreased knowledge of use of DME, and UE functional use,   and psychosocial skills including environmental adaptation and routines and behaviors.   IMPAIRMENTS: are limiting patient from ADLs, IADLs, rest and sleep, play, leisure, and social participation.   COMORBIDITIES: has no other co-morbidities that affects occupational performance. Patient will benefit from skilled OT to address above impairments and improve overall function.  MODIFICATION OR ASSISTANCE TO COMPLETE EVALUATION: No modification of tasks or assist necessary to complete an evaluation.  OT OCCUPATIONAL PROFILE AND HISTORY: Problem focused assessment: Including review of records relating to presenting  problem.  CLINICAL DECISION MAKING: LOW - limited treatment options, no task modification necessary  REHAB POTENTIAL: Good for goals  EVALUATION COMPLEXITY: Low   PLAN:  OT FREQUENCY: 1-2x/week  OT DURATION: 8 weeks  PLANNED INTERVENTIONS: 97168 OT Re-evaluation, 97535 self care/ADL training, 02889 therapeutic exercise, 97530 therapeutic activity, 97112 neuromuscular re-education, 97140 manual therapy, 97035 ultrasound, 97018 paraffin, 02960 fluidotherapy, 97034 contrast bath, 97760 Orthotic Initial, [id number] Orthotic/Prosthetic subsequent, scar mobilization, passive range of  motion, patient/family education, and DME and/or AE instructions  CONSULTED AND AGREED WITH PLAN OF CARE: Patient  Ancel Peters, OTR/L,CLT 06/28/2024, 5:06 PM

## 2024-07-05 ENCOUNTER — Ambulatory Visit: Admitting: Occupational Therapy

## 2024-07-05 DIAGNOSIS — L905 Scar conditions and fibrosis of skin: Secondary | ICD-10-CM

## 2024-07-05 DIAGNOSIS — M25649 Stiffness of unspecified hand, not elsewhere classified: Secondary | ICD-10-CM

## 2024-07-05 DIAGNOSIS — M6281 Muscle weakness (generalized): Secondary | ICD-10-CM

## 2024-07-05 DIAGNOSIS — M79641 Pain in right hand: Secondary | ICD-10-CM

## 2024-07-05 DIAGNOSIS — M25631 Stiffness of right wrist, not elsewhere classified: Secondary | ICD-10-CM

## 2024-07-05 NOTE — Therapy (Signed)
 OUTPATIENT OCCUPATIONAL THERAPY ORTHO TREATMENT  Patient Name: Melissa Dixon MRN: [id number] DOB:1985-11-05, 38 y.o., female Today's Date: 07/05/2024  PCP: Dr Tobie MART PROVIDER: Kip PA  END OF SESSION:  OT End of Session - 07/05/24 0824     Visit Number 9    Number of Visits 12    Date for Recertification  07/05/24    OT Start Time 0824    Activity Tolerance Patient tolerated treatment well    Behavior During Therapy [facility] for tasks assessed/performed          Past Medical History:  Diagnosis Date   Anxiety    Cervical radiculopathy    Complete tear of ulnar collateral ligament of interphalangeal joint of thumb    Complex tear of lateral meniscus of right knee as current injury, initial encounter    Cyst of knee joint, right    Gestational hypertension    Injury of left thumb    Pain in joints of left hand    Primary osteoarthritis of right knee    Past Surgical History:  Procedure Laterality Date   FRACTURE SURGERY Left 09/1995   5th toe   KNEE ARTHROSCOPY Right 12/02/2023   Procedure: RIGHT KNEE ARTHROSCOPY WITH DEBRIDEMENT AND PARTIAL LATERAL MENISCECTOMY AND AN OPEN EXCISION OF A CYST ON MEDIAL ASPECT OF RIGHT KNEE.;  Surgeon: Edie Norleen PARAS, MD;  Location: [facility] ORS;  Service: Orthopedics;  Laterality: Right;   LIGAMENT REPAIR Left 03/30/2024   Procedure: REPAIR, LIGAMENT;  Surgeon: Edie Norleen PARAS, MD;  Location: [facility] OR[facility];  Service: Orthopedics;  Laterality: Left;  ULNAR COLLATERAL  LIGAMENT REPAIR OF LEFT THUMB MCP JOINT   Patient Active Problem List   Diagnosis Date Noted   GAD (generalized anxiety disorder) 07/25/2020   MDD (major depressive disorder), recurrent episode, mild 07/25/2020   Attention and concentration deficit 07/25/2020   Bilateral carpal tunnel syndrome 05/11/2020    ONSET DATE: 03/30/24  REFERRING DIAG: L thumb UCL repair  THERAPY DIAG:  Stiffness of thumb joint  Pain in right hand  Stiffness of right wrist, not elsewhere  classified  Scar condition and fibrosis of skin  Muscle weakness (generalized)  Rationale for Evaluation and Treatment: Rehabilitation  SUBJECTIVE:   SUBJECTIVE STATEMENT  I am doing good.  Not really pain and using about 85% to 90%.  I just do not trust yet fully.  And twisting the putty I did not do.  But I can turn a doorknob and opening jars. pt accompanied by: self  PERTINENT HISTORY: 04/27/24 Ortho visit -  Impression: Rupture of ulnar collateral ligament of left thumb, sequela [S63.642S] Rupture of ulnar collateral ligament of left thumb, sequela (primary encounter diagnosis)  Plan:  1. Treatment options were discussed today with the patient. 2. Cast was removed at today's visit, skin examination demonstrates a well-healing surgical incision along the ulnar aspect of the thumb. 3. The patient was transitioned from the thumb spica cast into a thumb spica to her wrist splint. The patient was instructed to wear this at all times except for showering and she was instructed that she could come out a few times a day to work on gentle wrist motion and gentle flexion extension of her thumb but she was instructed to avoid any gripping or lifting with the left hand. 4. The patient was given a order for occupational therapy to begin working on gentle range of motion to the left wrist, we will wait to begin working on any strengthening of the thumb  until after her next postop appointment. 5. The patient will follow-up with Dr. Edie at her next scheduled visit for repeat evaluation of the thumb and increase activity level. 6. She can contact the clinic if she has any questions, new symptoms develop or symptoms worsen.   PRECAUTIONS: Ortho note for detail    WEIGHT BEARING RESTRICTIONS: NWB L hand  PAIN:  Are you having pain?  Some soreness and then tenderness at the thumb IP  FALLS: Has patient fallen in last 6 months? 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  PATIENT GOALS: I want my thumb pain better my motion and strength so that I can use it.  To grip and pull and push and pinch and pick up and open things  NEXT MD VISIT: 05/13/24  OBJECTIVE:  Note: Objective measures were completed at Evaluation unless otherwise noted.  HAND DOMINANCE: Right  ADLs: Unable to use thumb in any pinching or gripping activities-using mostly fingers and left hand  FUNCTIONAL OUTCOME MEASURES: Next visit  UPPER EXTREMITY ROM:     Active ROM Right eval Left eval L 05/23/24  Shoulder flexion     Shoulder abduction     Shoulder adduction     Shoulder extension     Shoulder internal rotation     Shoulder external rotation     Elbow flexion     Elbow extension     Wrist flexion  50 72  Wrist extension  55 67  Wrist ulnar deviation  34 35  Wrist radial deviation  20 20  Wrist pronation     Wrist supination     (Blank rows = not tested)  Active ROM Right eval Left eval L 05/23/24 L 05/26/24 L 06/02/24 L 06/06/24  L 06/10/24 L 06/28/24 L 07/05/24  Thumb MCP (0-60) 70 15 30 40 45 50 55 50 60  Thumb IP (0-80) 65 20 20  35 35 40 45 45 45  Thumb Radial abd/add (0-55)  NT  50 50       Thumb Palmar abd/add (0-45)  NT  65 65       Thumb Opposition to Small Finger Opposition to distal palmar crease     Opposition to distal fifth  Opposition to distal fold of fifth Opposition to second fold of fifth Opposition to second fold of fifth  Index MCP (0-90)            Index PIP (0-100)            Index DIP (0-70)             Long MCP (0-90)             Long PIP (0-100)             Long DIP (0-70)             Ring MCP (0-90)             Ring PIP (0-100)             Ring DIP (0-70)             Little MCP (0-90)             Little PIP (0-100)             Little DIP (0-70)             (  Blank rows = not tested)  Composite flexion or fisting  within normal limits  HAND FUNCTION: 05/30/24 Grip strength: Right: 60 lbs; Left: 18 lbs, Lateral pinch: Right: 20 lbs, Left: 3 lbs, and 3 point pinch: Right: 14 lbs, Left: 3 lbs 06/02/24 Grip strength: Right: 60 lbs; Left: 29 lbs, Lateral pinch: Right: 20 lbs, Left: 5 lbs, and 3 point pinch: Right: 14 lbs, Left: 4 lbs 06/06/24 Grip strength: Right: 60 lbs; Left: 32 lbs, Lateral pinch: Right: 20 lbs, Left: 8 lbs, and 3 point pinch: Right: 14 lbs, Left: 5 lbs 06/10/24 Grip strength: Right: 60 lbs; Left: 32 lbs, Lateral pinch: Right: 20 lbs, Left: 9 lbs, and 3 point pinch: Right: 14 lbs, Left: 6 lbs 06/28/24 Grip strength: Right: 60 lbs; Left: 40 lbs, Lateral pinch: Right: 20 lbs, Left: 11 lbs, and 3 point pinch: Right: 14 lbs, Left: 10 lbs 07/05/24 Grip strength: Right: 60 lbs; Left: 45 lbs, Lateral pinch: Right: 20 lbs, Left: 12 lbs, and 3 point pinch: Right: 14 lbs, Left: 10 lb COORDINATION:  COORDINATION: Impaired immobilized with thumb  SENSATION: Patient denies any sensory issues  EDEMA: Edema over the base of thumb.  COGNITION: Overall cognitive status: Within functional limits for tasks assessed  TREATMENT DATE: 07/05/24                                                                                                                             Has been doing the thumb stretches and opposition several times during the day.  With great improvement.   Opposition to second fold of fifth. Less pain in the thumb.   Less guarding but still favoring it with heavy activities.  That involve twisting or pulling or pinching with effort  Grip improved.  Prevention about the same.  See flowsheet  Manual therapy:   seen this date for soft tissue massage and mobilizations for carpal and metacarpal spreads, webspace soft tissue and prolonged stretch for wrist composite flexion Graston tool #2 done for sweeping over the volar and radial  forearm and hand.   Therapeutic Exercises:     Passive range  of motion for composite flexion to second fold for 4th and 5th.  10 reps Opposition to 2nd through 5th,   8 reps   Green medium firm putty- for larger ball - use  2/3 of  putty Gripping 2 sets 15 reps   2 times a day Pulling 2 sets of 12 2 times a day Continue with lateral pinch making sure alignment of thumb 2 sets of 15 reps Patient to hold off on 3-point pinch. Did at close independent 8 pounds for lateral pinch with gentle Coban wrap around thumb IP for support and to help for tenderness 2 sets of 6-8 reps patient to watch alignment of thumb. 2 x day - increase gradually over the next week if pain free - 3rd set   Continue with strengthening.  At rubber band 2 for palmar  radial abduction 2 sets of 12 pain-free 2 times a day As well as isometric strengthening 5 times a day with thumb and 45 degrees palmar abduction in all directions pain-free       PATIENT EDUCATION: Education details: findings of eval and HEP  Person educated: Patient Education method: Explanation, Demonstration, Tactile cues, Verbal cues, and Handouts Education comprehension: verbalized understanding, returned demonstration, verbal cues required, and needs further education    GOALS: Goals reviewed with patient? Yes   LONG TERM GOALS: Target date: 8 wks  Patient to be independent in home program to decrease pain to less than a 2/10 at rest. Baseline: No knowledge patient's pain commands 7/10 in thumb and webspace at rest Goal status: INITIAL  2.  Left thumb flexion improved for patient to be able to do opposition to fifth digit symptom-free to initiate doing buttons and sips Baseline: Right thumb IP flexion 20 and MC flexion 15-resting pain 7/10 Goal status: INITIAL  3.  Left thumb palmar radial abduction active range of motion strength improved for patient to be able to pick up a glass and turn a doorknob symptom-free Baseline:  Goal status: INITIAL  4.  Left thumb fine motor improved for patient to  manipulate and retrieve objects from palm to fingertips symptom-free Baseline: Thumb immobilized in thumb spica Goal status: INITIAL  5.  Left t grip) strength improved to within normal range for her age to be able to hold a plate, carry 5 pounds and open to stay symptom-free Baseline: Patient immobilized in thumb spica resting pain 7/10 Goal status: INITIAL  6.  Left wrist active range of motion strength improved to within normal limits for patient to push and pull heavy door, turn doorknob and return to prior level of function symptom-free Baseline: Wrist flexion 50 and extension 55.  With some discomfort and pain Goal status: INITIAL  ASSESSMENT:  CLINICAL IMPRESSION: Patient seen today for occupational therapy  for left thumb ulnar collateral ligament repair by Dr. Edie on 03/30/2024.  Patient presented with prefab thumb spica in place. Pain 5/10 at eval.   patient reports using her hand probably 85% to 90%.  Still favoring and not trusting thumb fully.  But doing most everything.  Did not wear brace since last time.  Patient grip improved greatly.  But prehension about the same.  Patient still decreased in grip strength as well as lateral pinch.  As well as thumb palmar radial abduction.  Patient to focus with lateral pinch on alignment of thumb.  Reinforce quality more than quantity with putty.  Some soreness at thumb IP.  Did take away twisting with putty and working more on isometric for palmar radial abduction all planes as well as rubber band for radial and palmar abduction.  Patient remains limited in functional use by increased pain decreased motion and strength.  Patient can benefit from skilled OT services to decrease scar tissue and pain and increased motion and strength to return to prior level of function.  PERFORMANCE DEFICITS: in functional skills including ADLs, IADLs, ROM, strength, pain, flexibility, decreased knowledge of use of DME, and UE functional use,   and psychosocial  skills including environmental adaptation and routines and behaviors.   IMPAIRMENTS: are limiting patient from ADLs, IADLs, rest and sleep, play, leisure, and social participation.   COMORBIDITIES: has no other co-morbidities that affects occupational performance. Patient will benefit from skilled OT to address above impairments and improve overall function.  MODIFICATION OR ASSISTANCE TO COMPLETE EVALUATION: No modification  of tasks or assist necessary to complete an evaluation.  OT OCCUPATIONAL PROFILE AND HISTORY: Problem focused assessment: Including review of records relating to presenting problem.  CLINICAL DECISION MAKING: LOW - limited treatment options, no task modification necessary  REHAB POTENTIAL: Good for goals  EVALUATION COMPLEXITY: Low   PLAN:  OT FREQUENCY: 1-2x/week  OT DURATION: 8 weeks  PLANNED INTERVENTIONS: 97168 OT Re-evaluation, 97535 self care/ADL training, 02889 therapeutic exercise, 97530 therapeutic activity, 97112 neuromuscular re-education, 97140 manual therapy, 97035 ultrasound, 97018 paraffin, 02960 fluidotherapy, 97034 contrast bath, 97760 Orthotic Initial, [id number] Orthotic/Prosthetic subsequent, scar mobilization, passive range of motion, patient/family education, and DME and/or AE instructions  CONSULTED AND AGREED WITH PLAN OF CARE: Patient  Melissa Dixon, OTR/L,CLT 07/05/2024, 8:26 AM

## 2024-07-12 ENCOUNTER — Ambulatory Visit: Admitting: Occupational Therapy

## 2024-07-12 DIAGNOSIS — M79641 Pain in right hand: Secondary | ICD-10-CM

## 2024-07-12 DIAGNOSIS — L905 Scar conditions and fibrosis of skin: Secondary | ICD-10-CM

## 2024-07-12 DIAGNOSIS — M25631 Stiffness of right wrist, not elsewhere classified: Secondary | ICD-10-CM

## 2024-07-12 DIAGNOSIS — M6281 Muscle weakness (generalized): Secondary | ICD-10-CM

## 2024-07-12 DIAGNOSIS — M25649 Stiffness of unspecified hand, not elsewhere classified: Secondary | ICD-10-CM

## 2024-07-12 NOTE — Therapy (Signed)
 OUTPATIENT OCCUPATIONAL THERAPY ORTHO TREATMENT  Patient Name: Melissa Dixon MRN: [id number] DOB:[birth year], 38 y.o., female Today's Date: 07/12/2024  PCP: Dr Tobie MART PROVIDER: Kip PA  END OF SESSION:  OT End of Session - 07/12/24 0819     Visit Number 10    Number of Visits 12    Date for Recertification  08/17/24    OT Start Time 0820    OT Stop Time 0900    OT Time Calculation (min) 40 min    Activity Tolerance Patient tolerated treatment well    Behavior During Therapy [facility] for tasks assessed/performed          Past Medical History:  Diagnosis Date   Anxiety    Cervical radiculopathy    Complete tear of ulnar collateral ligament of interphalangeal joint of thumb    Complex tear of lateral meniscus of right knee as current injury, initial encounter    Cyst of knee joint, right    Gestational hypertension    Injury of left thumb    Pain in joints of left hand    Primary osteoarthritis of right knee    Past Surgical History:  Procedure Laterality Date   FRACTURE SURGERY Left 09/1995   5th toe   KNEE ARTHROSCOPY Right 12/02/2023   Procedure: RIGHT KNEE ARTHROSCOPY WITH DEBRIDEMENT AND PARTIAL LATERAL MENISCECTOMY AND AN OPEN EXCISION OF A CYST ON MEDIAL ASPECT OF RIGHT KNEE.;  Surgeon: Edie Norleen PARAS, MD;  Location: [facility] OR[facility];  Service: Orthopedics;  Laterality: Right;   LIGAMENT REPAIR Left 03/30/2024   Procedure: REPAIR, LIGAMENT;  Surgeon: Edie Norleen PARAS, MD;  Location: [facility];  Service: Orthopedics;  Laterality: Left;  ULNAR COLLATERAL  LIGAMENT REPAIR OF LEFT THUMB MCP JOINT   Patient Active Problem List   Diagnosis Date Noted   GAD (generalized anxiety disorder) 07/25/2020   MDD (major depressive disorder), recurrent episode, mild 07/25/2020   Attention and concentration deficit 07/25/2020   Bilateral carpal tunnel syndrome 05/11/2020    ONSET DATE: 03/30/24  REFERRING DIAG: L thumb UCL repair  THERAPY DIAG:  Stiffness of thumb  joint  Pain in right hand  Stiffness of right wrist, not elsewhere classified  Scar condition and fibrosis of skin  Muscle weakness (generalized)  Rationale for Evaluation and Treatment: Rehabilitation  SUBJECTIVE:   SUBJECTIVE STATEMENT  Mostly soreness after work out - doing mostly everything - buttons, pour gallon milk - pull pants , tie shoes,  pt accompanied by: self  PERTINENT HISTORY: 04/27/24 Ortho visit -  Impression: Rupture of ulnar collateral ligament of left thumb, sequela [S63.642S] Rupture of ulnar collateral ligament of left thumb, sequela (primary encounter diagnosis)  Plan:  1. Treatment options were discussed today with the patient. 2. Cast was removed at today's visit, skin examination demonstrates a well-healing surgical incision along the ulnar aspect of the thumb. 3. The patient was transitioned from the thumb spica cast into a thumb spica to her wrist splint. The patient was instructed to wear this at all times except for showering and she was instructed that she could come out a few times a day to work on gentle wrist motion and gentle flexion extension of her thumb but she was instructed to avoid any gripping or lifting with the left hand. 4. The patient was given a order for occupational therapy to begin working on gentle range of motion to the left wrist, we will wait to begin working on any strengthening of the thumb until after her next  postop appointment. 5. The patient will follow-up with Dr. Edie at her next scheduled visit for repeat evaluation of the thumb and increase activity level. 6. She can contact the clinic if she has any questions, new symptoms develop or symptoms worsen.   PRECAUTIONS: Ortho note for detail    WEIGHT BEARING RESTRICTIONS: NWB L hand  PAIN:  Are you having pain?  Soreness mostly   FALLS: Has patient fallen in last 6 months? 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  PATIENT GOALS: I want my thumb pain better my motion and strength so that I can use it.  To grip and pull and push and pinch and pick up and open things  NEXT MD VISIT: 05/13/24  OBJECTIVE:  Note: Objective measures were completed at Evaluation unless otherwise noted.  HAND DOMINANCE: Right  ADLs: Unable to use thumb in any pinching or gripping activities-using mostly fingers and left hand  FUNCTIONAL OUTCOME MEASURES: Next visit  UPPER EXTREMITY ROM:     Active ROM Right eval Left eval L 05/23/24  Shoulder flexion     Shoulder abduction     Shoulder adduction     Shoulder extension     Shoulder internal rotation     Shoulder external rotation     Elbow flexion     Elbow extension     Wrist flexion  50 72  Wrist extension  55 67  Wrist ulnar deviation  34 35  Wrist radial deviation  20 20  Wrist pronation     Wrist supination     (Blank rows = not tested)  Active ROM Right eval Left eval L 05/23/24 L 05/26/24 L 06/02/24 L 06/06/24  L 06/10/24 L 06/28/24 L 07/05/24 L 07/12/24  Thumb MCP (0-60) 70 15 30 40 45 50 55 50 60 60   Thumb IP (0-80) 65 20 20  35 35 40 45 45 45 55 in session after paraffin and PROM   Thumb Radial abd/add (0-55)  NT  50 50        Thumb Palmar abd/add (0-45)  NT  65 65        Thumb Opposition to Small Finger Opposition to distal palmar crease     Opposition to distal fifth  Opposition to distal fold of fifth Opposition to second fold of fifth Opposition to second fold of fifth Opposition 2nd fold 5th   Index MCP (0-90)             Index PIP (0-100)             Index DIP (0-70)              Long MCP (0-90)              Long PIP (0-100)              Long DIP (0-70)              Ring MCP (0-90)              Ring PIP (0-100)              Ring DIP (0-70)              Little MCP (0-90)              Little PIP (0-100)  Little DIP (0-70)               (Blank rows = not tested)  Composite flexion or fisting within normal limits  HAND FUNCTION: 05/30/24 Grip strength: Right: 60 lbs; Left: 18 lbs, Lateral pinch: Right: 20 lbs, Left: 3 lbs, and 3 point pinch: Right: 14 lbs, Left: 3 lbs 06/02/24 Grip strength: Right: 60 lbs; Left: 29 lbs, Lateral pinch: Right: 20 lbs, Left: 5 lbs, and 3 point pinch: Right: 14 lbs, Left: 4 lbs 06/06/24 Grip strength: Right: 60 lbs; Left: 32 lbs, Lateral pinch: Right: 20 lbs, Left: 8 lbs, and 3 point pinch: Right: 14 lbs, Left: 5 lbs 06/10/24 Grip strength: Right: 60 lbs; Left: 32 lbs, Lateral pinch: Right: 20 lbs, Left: 9 lbs, and 3 point pinch: Right: 14 lbs, Left: 6 lbs 06/28/24 Grip strength: Right: 60 lbs; Left: 40 lbs, Lateral pinch: Right: 20 lbs, Left: 11 lbs, and 3 point pinch: Right: 14 lbs, Left: 10 lbs 07/05/24 Grip strength: Right: 60 lbs; Left: 45 lbs, Lateral pinch: Right: 20 lbs, Left: 12 lbs, and 3 point pinch: Right: 14 lbs, Left: 10 lb 07/12/24 Grip strength: Right: 60 lbs; Left: 55 lbs, Lateral pinch: Right: 20 lbs, Left: 14 lbs, and 3 point pinch: Right: 14 lbs, Left: 12 lb COORDINATION:  COORDINATION: Impaired immobilized with thumb  SENSATION: Patient denies any sensory issues  EDEMA: Edema over the base of thumb.  COGNITION: Overall cognitive status: Within functional limits for tasks assessed  TREATMENT DATE: 07/12/24                                                                                                                             Patient arrived with increased functional use.  Trusting it more the thumb.  Still opposition to second fold of fifth Did not do any lateral loading with putty. Can fasten her bra, buttons open 1 jar.  Manual therapy:   Focus this date on gentle joint mobilization to thumb IP with passive range of motion to flexion.   Graston tool #2 done for sweeping over the volar of thumb  Therapeutic Exercises:     Passive range of motion to thumb IP as  well as as composite flexion to second fold of fifth digit    Green medium firm putty- for larger ball - use  2/3 of  putty Continue gripping 2 sets 15 reps   2 times a day Continue pulling 2 sets of 12 2 times a day Continue with the putty for lateral pinch and she can add 3-point pinch to putty  Continue 8 pounds for 3-point pinch but added this date lateral pinch patient did not need Coban wrap around IP for cushioning anymore.   2 x day - increase gradually over the next week to increase reps and set  Patient was able to do with light blue putty twisting with thumb loading and laterally tolerating it well and feeling more secure.  Patient can do 1 to 2 minute also added for her rubber band in combination with rotation of lacrosse ball against resistance for lateral loading ulnarly and radially with rubber band 1 to 2 minutes pain-free Patient was able to pick up for 1 cm object to palm and retrieve 1 at a time using IP flexion combination with thumb flexion and opposition to retrieve objects 1 to 2 minutes       PATIENT EDUCATION: Education details: findings of eval and HEP  Person educated: Patient Education method: Explanation, Demonstration, Tactile cues, Verbal cues, and Handouts Education comprehension: verbalized understanding, returned demonstration, verbal cues required, and needs further education    GOALS: Goals reviewed with patient? Yes   LONG TERM GOALS: Target date: 6 weeks  Patient to be independent in home program to decrease pain to less than a 2/10 at rest. Baseline: No knowledge patient's pain commands 7/10 in thumb and webspace at rest Goal status: Met  2.  Left thumb flexion improved for patient to be able to do opposition to fifth digit symptom-free to initiate doing buttons and sips Baseline: Right thumb IP flexion 20 and MC flexion 15-resting pain 7/10 Goal status: Met  3.  Left thumb palmar radial abduction active range of motion strength improved  for patient to be able to pick up a glass and turn a doorknob symptom-free Baseline:  Goal status: Improving  4.  Left thumb fine motor improved for patient to manipulate and retrieve objects from palm to fingertips symptom-free Baseline: Thumb immobilized in thumb spica Goal status: Improving  5.  Left t grip) strength improved to within normal range for her age to be able to hold a plate, carry 5 pounds and open to stay symptom-free Baseline: Patient immobilized in thumb spica resting pain 7/10 Goal status: Met  6.  Left wrist active range of motion strength improved to within normal limits for patient to push and pull heavy door, turn doorknob and return to prior level of function symptom-free Baseline: Wrist flexion 50 and extension 55.  With some discomfort and pain Goal status: Met  ASSESSMENT:  CLINICAL IMPRESSION: Patient seen today for occupational therapy  for left thumb ulnar collateral ligament repair by Dr. Edie on 03/30/2024.  Patient presented with prefab thumb spica in place. Pain 5/10 at eval.   NOW patient with great progress in grip and prehension strength.  Patient is normal range for 3-point pinch and grip.  Lateral pinch still decreased.  As well as lateral loading improving.  Able to at twisting this date into putty.  Working also on lateral loading with a rubber band in combination with lacrosse ball.  Upgrade patient's home program at this stage patient to continue for 2 weeks and follow-up.  IP flexion still decreased in combination with opposition to second hold of fifth..  Patient remains limited in functional use by increased pain decreased motion and strength.  Patient can benefit from skilled OT services to decrease scar tissue and pain and increased motion and strength to return to prior level of function.  PERFORMANCE DEFICITS: in functional skills including ADLs, IADLs, ROM, strength, pain, flexibility, decreased knowledge of use of DME, and UE functional use,    and psychosocial skills including environmental adaptation and routines and behaviors.   IMPAIRMENTS: are limiting patient from ADLs, IADLs, rest and sleep, play, leisure, and social participation.   COMORBIDITIES: has no other co-morbidities that affects occupational performance. Patient will benefit from skilled OT to address above impairments and improve  overall function.  MODIFICATION OR ASSISTANCE TO COMPLETE EVALUATION: No modification of tasks or assist necessary to complete an evaluation.  OT OCCUPATIONAL PROFILE AND HISTORY: Problem focused assessment: Including review of records relating to presenting problem.  CLINICAL DECISION MAKING: LOW - limited treatment options, no task modification necessary  REHAB POTENTIAL: Good for goals  EVALUATION COMPLEXITY: Low   PLAN:  OT FREQUENCY: 3 visits  OT DURATION: 6 weeks  PLANNED INTERVENTIONS: 97168 OT Re-evaluation, 97535 self care/ADL training, 02889 therapeutic exercise, 97530 therapeutic activity, 97112 neuromuscular re-education, 97140 manual therapy, 97035 ultrasound, 97018 paraffin, 02960 fluidotherapy, 97034 contrast bath, 97760 Orthotic Initial, [id number] Orthotic/Prosthetic subsequent, scar mobilization, passive range of motion, patient/family education, and DME and/or AE instructions  CONSULTED AND AGREED WITH PLAN OF CARE: Patient  Ancel Peters, OTR/L,CLT 07/12/2024, 9:09 AM

## 2024-07-25 NOTE — Therapy (Unsigned)
 OUTPATIENT OCCUPATIONAL THERAPY ORTHO TREATMENT  Patient Name: Melissa Dixon MRN: [id number] DOB:10-Nov-1985, 38 y.o., female Today's Date: 07/26/2024  PCP: Dr Tobie MART PROVIDER: Kip PA  END OF SESSION:  OT End of Session - 07/26/24 0848     Visit Number 11    Number of Visits 12    Date for Recertification  08/17/24    OT Start Time 0850    OT Stop Time 0921    OT Time Calculation (min) 31 min    Activity Tolerance Patient tolerated treatment well    Behavior During Therapy [facility] for tasks assessed/performed           Past Medical History:  Diagnosis Date   Anxiety    Cervical radiculopathy    Complete tear of ulnar collateral ligament of interphalangeal joint of thumb    Complex tear of lateral meniscus of right knee as current injury, initial encounter    Cyst of knee joint, right    Gestational hypertension    Injury of left thumb    Pain in joints of left hand    Primary osteoarthritis of right knee    Past Surgical History:  Procedure Laterality Date   FRACTURE SURGERY Left 09/1995   5th toe   KNEE ARTHROSCOPY Right 12/02/2023   Procedure: RIGHT KNEE ARTHROSCOPY WITH DEBRIDEMENT AND PARTIAL LATERAL MENISCECTOMY AND AN OPEN EXCISION OF A CYST ON MEDIAL ASPECT OF RIGHT KNEE.;  Surgeon: Edie Norleen PARAS, MD;  Location: [facility];  Service: Orthopedics;  Laterality: Right;   LIGAMENT REPAIR Left 03/30/2024   Procedure: REPAIR, LIGAMENT;  Surgeon: Edie Norleen PARAS, MD;  Location: [facility];  Service: Orthopedics;  Laterality: Left;  ULNAR COLLATERAL  LIGAMENT REPAIR OF LEFT THUMB MCP JOINT   Patient Active Problem List   Diagnosis Date Noted   GAD (generalized anxiety disorder) 07/25/2020   MDD (major depressive disorder), recurrent episode, mild 07/25/2020   Attention and concentration deficit 07/25/2020   Bilateral carpal tunnel syndrome 05/11/2020    ONSET DATE: 03/30/24  REFERRING DIAG: L thumb UCL repair  THERAPY DIAG:  Stiffness of thumb  joint  Pain in right hand  Scar condition and fibrosis of skin  Rationale for Evaluation and Treatment: Rehabilitation  SUBJECTIVE:   SUBJECTIVE STATEMENT  Reports knee pain, has not been doing her exercises the past week only opposition and DIP ROM   pt accompanied by: self  PERTINENT HISTORY: 04/27/24 Ortho visit -  Impression: Rupture of ulnar collateral ligament of left thumb, sequela [S63.642S] Rupture of ulnar collateral ligament of left thumb, sequela (primary encounter diagnosis)  Plan:  1. Treatment options were discussed today with the patient. 2. Cast was removed at today's visit, skin examination demonstrates a well-healing surgical incision along the ulnar aspect of the thumb. 3. The patient was transitioned from the thumb spica cast into a thumb spica to her wrist splint. The patient was instructed to wear this at all times except for showering and she was instructed that she could come out a few times a day to work on gentle wrist motion and gentle flexion extension of her thumb but she was instructed to avoid any gripping or lifting with the left hand. 4. The patient was given a order for occupational therapy to begin working on gentle range of motion to the left wrist, we will wait to begin working on any strengthening of the thumb until after her next postop appointment. 5. The patient will follow-up with Dr. Edie at her next  scheduled visit for repeat evaluation of the thumb and increase activity level. 6. She can contact the clinic if she has any questions, new symptoms develop or symptoms worsen.   PRECAUTIONS: Ortho note for detail    WEIGHT BEARING RESTRICTIONS: NWB L hand  PAIN:  Are you having pain?  Soreness mostly   FALLS: Has patient fallen in last 6 months? 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  PATIENT GOALS: I  want my thumb pain better my motion and strength so that I can use it.  To grip and pull and push and pinch and pick up and open things  NEXT MD VISIT:   OBJECTIVE:  Note: Objective measures were completed at Evaluation unless otherwise noted.  HAND DOMINANCE: Right  ADLs: Unable to use thumb in any pinching or gripping activities-using mostly fingers and left hand  FUNCTIONAL OUTCOME MEASURES: Next visit  UPPER EXTREMITY ROM:     Active ROM Right eval Left eval L 05/23/24  Shoulder flexion     Shoulder abduction     Shoulder adduction     Shoulder extension     Shoulder internal rotation     Shoulder external rotation     Elbow flexion     Elbow extension     Wrist flexion  50 72  Wrist extension  55 67  Wrist ulnar deviation  34 35  Wrist radial deviation  20 20  Wrist pronation     Wrist supination     (Blank rows = not tested)  Active ROM Right eval Left eval L 05/23/24 L 05/26/24 L 06/02/24 L 06/06/24  L 06/10/24 L 06/28/24 L 07/05/24 L 07/12/24  Thumb MCP (0-60) 70 15 30 40 45 50 55 50 60 60   Thumb IP (0-80) 65 20 20  35 35 40 45 45 45 55 in session after paraffin and PROM   Thumb Radial abd/add (0-55)  NT  50 50        Thumb Palmar abd/add (0-45)  NT  65 65        Thumb Opposition to Small Finger Opposition to distal palmar crease     Opposition to distal fifth  Opposition to distal fold of fifth Opposition to second fold of fifth Opposition to second fold of fifth Opposition 2nd fold 5th   Index MCP (0-90)             Index PIP (0-100)             Index DIP (0-70)              Long MCP (0-90)              Long PIP (0-100)              Long DIP (0-70)              Ring MCP (0-90)              Ring PIP (0-100)              Ring DIP (0-70)              Little MCP (0-90)              Little PIP (0-100)              Little DIP (0-70)              (  Blank rows = not tested)  Composite flexion or fisting within normal limits  HAND FUNCTION: 05/30/24 Grip  strength: Right: 60 lbs; Left: 18 lbs, Lateral pinch: Right: 20 lbs, Left: 3 lbs, and 3 point pinch: Right: 14 lbs, Left: 3 lbs 06/02/24 Grip strength: Right: 60 lbs; Left: 29 lbs, Lateral pinch: Right: 20 lbs, Left: 5 lbs, and 3 point pinch: Right: 14 lbs, Left: 4 lbs 06/06/24 Grip strength: Right: 60 lbs; Left: 32 lbs, Lateral pinch: Right: 20 lbs, Left: 8 lbs, and 3 point pinch: Right: 14 lbs, Left: 5 lbs 06/10/24 Grip strength: Right: 60 lbs; Left: 32 lbs, Lateral pinch: Right: 20 lbs, Left: 9 lbs, and 3 point pinch: Right: 14 lbs, Left: 6 lbs 06/28/24 Grip strength: Right: 60 lbs; Left: 40 lbs, Lateral pinch: Right: 20 lbs, Left: 11 lbs, and 3 point pinch: Right: 14 lbs, Left: 10 lbs 07/05/24 Grip strength: Right: 60 lbs; Left: 45 lbs, Lateral pinch: Right: 20 lbs, Left: 12 lbs, and 3 point pinch: Right: 14 lbs, Left: 10 lb 07/12/24 Grip strength: Right: 60 lbs; Left: 55 lbs, Lateral pinch: Right: 20 lbs, Left: 14 lbs, and 3 point pinch: Right: 14 lbs, Left: 12 lb  COORDINATION: Impaired immobilized with thumb  SENSATION: Patient denies any sensory issues  EDEMA: Edema over the base of thumb.  COGNITION: Overall cognitive status: Within functional limits for tasks assessed  TREATMENT DATE: 07/26/24                                                                                                                             Reports carpal tunnel symptoms in R hand, educated on positioning for home work station setup and brace for nighttime wear. has not been doing her exercises the past week only opposition and DIP ROM, plan to buy ball today for HEP.   Manual therapy:   Focus this date on gentle joint mobilization to thumb IP with passive range of motion to flexion.   Graston tool #2 for sweeping forearm and tool #6 done for sweeping over the volar of thumb  Therapeutic Exercises:   Passive range of motion to thumb IP as well as as composite flexion to second fold of fifth digit  gripping  teal putty 3 sets 15 reps, 2 times a day pulling apart teal putty 3 sets of 12, 2 times a day Black resistive clip for lateral pinch and 3-point pinch x6 x 3 sets, 2 times /day Pt agreeable to increase to 3 times/day for 2 weeks and follow up  Upgraded to teal putty for twisting with thumb in C position Patient can do 1 to 2 minute rotation of lacrosse ball against resistance of lateral band for lateral loading ulnarly and radially 1 to 2 minutes pain-free Picking up 1 cm object to palm and translate palm to fingertip using IP flexion combination with thumb flexion for 2 min (privded objects for HEP).        PATIENT  EDUCATION: Education details: findings of eval and HEP  Person educated: Patient Education method: Explanation, Demonstration, Tactile cues, Verbal cues, and Handouts Education comprehension: verbalized understanding, returned demonstration, verbal cues required, and needs further education    GOALS: Goals reviewed with patient? Yes   LONG TERM GOALS: Target date: 6 weeks  Patient to be independent in home program to decrease pain to less than a 2/10 at rest. Baseline: No knowledge patient's pain commands 7/10 in thumb and webspace at rest Goal status: Met  2.  Left thumb flexion improved for patient to be able to do opposition to fifth digit symptom-free to initiate doing buttons and sips Baseline: Right thumb IP flexion 20 and MC flexion 15-resting pain 7/10 Goal status: Met  3.  Left thumb palmar radial abduction active range of motion strength improved for patient to be able to pick up a glass and turn a doorknob symptom-free Baseline:  Goal status: Improving  4.  Left thumb fine motor improved for patient to manipulate and retrieve objects from palm to fingertips symptom-free Baseline: Thumb immobilized in thumb spica Goal status: Improving  5.  Left t grip) strength improved to within normal range for her age to be able to hold a plate, carry 5 pounds  and open to stay symptom-free Baseline: Patient immobilized in thumb spica resting pain 7/10 Goal status: Met  6.  Left wrist active range of motion strength improved to within normal limits for patient to push and pull heavy door, turn doorknob and return to prior level of function symptom-free Baseline: Wrist flexion 50 and extension 55.  With some discomfort and pain Goal status: Met  ASSESSMENT:  CLINICAL IMPRESSION: Patient seen today for occupational therapy  for left thumb ulnar collateral ligament repair by Dr. Edie on 03/30/2024.  Patient presented with prefab thumb spica in place. Pain 5/10 at eval.  NOW: No pain reported in L hand, reports carpal tunnel symptoms in R hand - educated on ergonomic modifications and brace for nighttime wear. Upgrade patient's HEP for teal putty and 3 sets each exercises 2x daily, continue for 2 weeks and follow-up.  IP flexion still decreased, decreased opposition to second hold of fifth, and decreased lateral pinch strength.  Patient remains limited in functional use by increased pain decreased motion and strength.  Patient can benefit from skilled OT services to decrease scar tissue and pain and increased motion and strength to return to prior level of function.  PERFORMANCE DEFICITS: in functional skills including ADLs, IADLs, ROM, strength, pain, flexibility, decreased knowledge of use of DME, and UE functional use,   and psychosocial skills including environmental adaptation and routines and behaviors.   IMPAIRMENTS: are limiting patient from ADLs, IADLs, rest and sleep, play, leisure, and social participation.   COMORBIDITIES: has no other co-morbidities that affects occupational performance. Patient will benefit from skilled OT to address above impairments and improve overall function.  MODIFICATION OR ASSISTANCE TO COMPLETE EVALUATION: No modification of tasks or assist necessary to complete an evaluation.  OT OCCUPATIONAL PROFILE AND HISTORY:  Problem focused assessment: Including review of records relating to presenting problem.  CLINICAL DECISION MAKING: LOW - limited treatment options, no task modification necessary  REHAB POTENTIAL: Good for goals  EVALUATION COMPLEXITY: Low   PLAN:  OT FREQUENCY: 3 visits  OT DURATION: 6 weeks  PLANNED INTERVENTIONS: 97168 OT Re-evaluation, 97535 self care/ADL training, 02889 therapeutic exercise, 97530 therapeutic activity, 97112 neuromuscular re-education, 97140 manual therapy, 97035 ultrasound, 97018 paraffin, 02960 fluidotherapy, 97034 contrast bath,  02239 Orthotic Initial, 02236 Orthotic/Prosthetic subsequent, scar mobilization, passive range of motion, patient/family education, and DME and/or AE instructions  CONSULTED AND AGREED WITH PLAN OF CARE: Patient  Elston JINNY Slot, OTR/L 07/26/2024, 9:21 AM

## 2024-07-26 ENCOUNTER — Ambulatory Visit: Attending: Student | Admitting: Occupational Therapy

## 2024-07-26 ENCOUNTER — Encounter: Payer: Self-pay | Admitting: Occupational Therapy

## 2024-07-26 DIAGNOSIS — M25631 Stiffness of right wrist, not elsewhere classified: Secondary | ICD-10-CM | POA: Insufficient documentation

## 2024-07-26 DIAGNOSIS — M25649 Stiffness of unspecified hand, not elsewhere classified: Secondary | ICD-10-CM | POA: Diagnosis present

## 2024-07-26 DIAGNOSIS — M79641 Pain in right hand: Secondary | ICD-10-CM | POA: Diagnosis present

## 2024-07-26 DIAGNOSIS — M6281 Muscle weakness (generalized): Secondary | ICD-10-CM | POA: Diagnosis present

## 2024-07-26 DIAGNOSIS — L905 Scar conditions and fibrosis of skin: Secondary | ICD-10-CM | POA: Diagnosis present

## 2024-08-09 ENCOUNTER — Ambulatory Visit: Payer: Self-pay | Admitting: Occupational Therapy

## 2024-08-09 DIAGNOSIS — M25631 Stiffness of right wrist, not elsewhere classified: Secondary | ICD-10-CM

## 2024-08-09 DIAGNOSIS — M25649 Stiffness of unspecified hand, not elsewhere classified: Secondary | ICD-10-CM | POA: Diagnosis not present

## 2024-08-09 DIAGNOSIS — M79641 Pain in right hand: Secondary | ICD-10-CM

## 2024-08-09 DIAGNOSIS — L905 Scar conditions and fibrosis of skin: Secondary | ICD-10-CM

## 2024-08-09 DIAGNOSIS — M6281 Muscle weakness (generalized): Secondary | ICD-10-CM

## 2024-08-09 NOTE — Therapy (Signed)
 OUTPATIENT OCCUPATIONAL THERAPY ORTHO TREATMENT  Patient Name: Melissa Dixon MRN: [id number] DOB:[birth year], 38 y.o., female Today's Date: 08/09/2024  PCP: Dr Tobie MART PROVIDER: Kip PA  END OF SESSION:  OT End of Session - 08/09/24 0818     Visit Number 12    Number of Visits 12    Date for Recertification  08/17/24    OT Start Time 0818    OT Stop Time 0845    OT Time Calculation (min) 27 min    Activity Tolerance Patient tolerated treatment well    Behavior During Therapy [facility] for tasks assessed/performed           Past Medical History:  Diagnosis Date   Anxiety    Cervical radiculopathy    Complete tear of ulnar collateral ligament of interphalangeal joint of thumb    Complex tear of lateral meniscus of right knee as current injury, initial encounter    Cyst of knee joint, right    Gestational hypertension    Injury of left thumb    Pain in joints of left hand    Primary osteoarthritis of right knee    Past Surgical History:  Procedure Laterality Date   FRACTURE SURGERY Left 09/1995   5th toe   KNEE ARTHROSCOPY Right 12/02/2023   Procedure: RIGHT KNEE ARTHROSCOPY WITH DEBRIDEMENT AND PARTIAL LATERAL MENISCECTOMY AND AN OPEN EXCISION OF A CYST ON MEDIAL ASPECT OF RIGHT KNEE.;  Surgeon: Edie Norleen PARAS, MD;  Location: [facility] ORS;  Service: Orthopedics;  Laterality: Right;   LIGAMENT REPAIR Left 03/30/2024   Procedure: REPAIR, LIGAMENT;  Surgeon: Edie Norleen PARAS, MD;  Location: [facility] OR[facility];  Service: Orthopedics;  Laterality: Left;  ULNAR COLLATERAL  LIGAMENT REPAIR OF LEFT THUMB MCP JOINT   Patient Active Problem List   Diagnosis Date Noted   GAD (generalized anxiety disorder) 07/25/2020   MDD (major depressive disorder), recurrent episode, mild 07/25/2020   Attention and concentration deficit 07/25/2020   Bilateral carpal tunnel syndrome 05/11/2020    ONSET DATE: 03/30/24  REFERRING DIAG: L thumb UCL repair  THERAPY DIAG:  Stiffness of thumb  joint  Pain in right hand  Scar condition and fibrosis of skin  Stiffness of right wrist, not elsewhere classified  Muscle weakness (generalized)  Rationale for Evaluation and Treatment: Rehabilitation  SUBJECTIVE:   SUBJECTIVE STATEMENT  Thumb feels good.  I am using it more on sometimes reaching and doing something but purposelessly oif my thumb can do it.  Like opening jars and picking things up  pt accompanied by: self  PERTINENT HISTORY: 04/27/24 Ortho visit -  Impression: Rupture of ulnar collateral ligament of left thumb, sequela [S63.642S] Rupture of ulnar collateral ligament of left thumb, sequela (primary encounter diagnosis)  Plan:  1. Treatment options were discussed today with the patient. 2. Cast was removed at today's visit, skin examination demonstrates a well-healing surgical incision along the ulnar aspect of the thumb. 3. The patient was transitioned from the thumb spica cast into a thumb spica to her wrist splint. The patient was instructed to wear this at all times except for showering and she was instructed that she could come out a few times a day to work on gentle wrist motion and gentle flexion extension of her thumb but she was instructed to avoid any gripping or lifting with the left hand. 4. The patient was given a order for occupational therapy to begin working on gentle range of motion to the left wrist, we will wait to  begin working on any strengthening of the thumb until after her next postop appointment. 5. The patient will follow-up with Dr. Edie at her next scheduled visit for repeat evaluation of the thumb and increase activity level. 6. She can contact the clinic if she has any questions, new symptoms develop or symptoms worsen.   PRECAUTIONS: Ortho note for detail    WEIGHT BEARING RESTRICTIONS: None  PAIN:  Are you having pain?  Soreness mostly   FALLS: Has patient fallen in last 6 months? 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  PATIENT GOALS: I want my thumb pain better my motion and strength so that I can use it.  To grip and pull and push and pinch and pick up and open things  NEXT MD VISIT:   OBJECTIVE:  Note: Objective measures were completed at Evaluation unless otherwise noted.  HAND DOMINANCE: Right  ADLs: Unable to use thumb in any pinching or gripping activities-using mostly fingers and left hand  FUNCTIONAL OUTCOME MEASURES: Next visit  UPPER EXTREMITY ROM:     Active ROM Right eval Left eval L 05/23/24  Shoulder flexion     Shoulder abduction     Shoulder adduction     Shoulder extension     Shoulder internal rotation     Shoulder external rotation     Elbow flexion     Elbow extension     Wrist flexion  50 72  Wrist extension  55 67  Wrist ulnar deviation  34 35  Wrist radial deviation  20 20  Wrist pronation     Wrist supination     (Blank rows = not tested)  Active ROM Right eval Left eval L 05/23/24 L 05/26/24 L 06/02/24 L 06/06/24  L 06/10/24 L 06/28/24 L 07/05/24 L 07/12/24  Thumb MCP (0-60) 70 15 30 40 45 50 55 50 60 60   Thumb IP (0-80) 65 20 20  35 35 40 45 45 45 55 in session after paraffin and PROM   Thumb Radial abd/add (0-55)  NT  50 50        Thumb Palmar abd/add (0-45)  NT  65 65        Thumb Opposition to Small Finger Opposition to distal palmar crease     Opposition to distal fifth  Opposition to distal fold of fifth Opposition to second fold of fifth Opposition to second fold of fifth Opposition 2nd fold 5th   Index MCP (0-90)             Index PIP (0-100)             Index DIP (0-70)              Long MCP (0-90)              Long PIP (0-100)              Long DIP (0-70)              Ring MCP (0-90)              Ring PIP (0-100)              Ring DIP (0-70)              Little MCP (0-90)  Little PIP (0-100)               Little DIP (0-70)              (Blank rows = not tested)  Composite flexion or fisting within normal limits  HAND FUNCTION: 05/30/24 Grip strength: Right: 60 lbs; Left: 18 lbs, Lateral pinch: Right: 20 lbs, Left: 3 lbs, and 3 point pinch: Right: 14 lbs, Left: 3 lbs 06/02/24 Grip strength: Right: 60 lbs; Left: 29 lbs, Lateral pinch: Right: 20 lbs, Left: 5 lbs, and 3 point pinch: Right: 14 lbs, Left: 4 lbs 06/06/24 Grip strength: Right: 60 lbs; Left: 32 lbs, Lateral pinch: Right: 20 lbs, Left: 8 lbs, and 3 point pinch: Right: 14 lbs, Left: 5 lbs 06/10/24 Grip strength: Right: 60 lbs; Left: 32 lbs, Lateral pinch: Right: 20 lbs, Left: 9 lbs, and 3 point pinch: Right: 14 lbs, Left: 6 lbs 06/28/24 Grip strength: Right: 60 lbs; Left: 40 lbs, Lateral pinch: Right: 20 lbs, Left: 11 lbs, and 3 point pinch: Right: 14 lbs, Left: 10 lbs 07/05/24 Grip strength: Right: 60 lbs; Left: 45 lbs, Lateral pinch: Right: 20 lbs, Left: 12 lbs, and 3 point pinch: Right: 14 lbs, Left: 10 lb 07/12/24 Grip strength: Right: 60 lbs; Left: 55 lbs, Lateral pinch: Right: 20 lbs, Left: 14 lbs, and 3 point pinch: Right: 14 lbs, Left: 12 lb 08/09/24 Grip strength: Right: 60 lbs; Left: 58 lbs, Lateral pinch: Right: 20 lbs, Left: 14 lbs, and 3 point pinch: Right: 14 lbs, Left: 12 lb COORDINATION: Impaired immobilized with thumb  SENSATION: Patient denies any sensory issues  EDEMA: Edema over the base of thumb.  COGNITION: Overall cognitive status: Within functional limits for tasks assessed  TREATMENT DATE: 08/09/24                                                                                                                             Patient arrived today with active range of motion for left wrist and thumb and fingers within normal limits except MCP IP flexion and to opposition.  Limited by endrange 10 degrees. Strength 5/5 for wrist and forearm Thumb palmar radial abduction 5 -/5 Opposition to fifth 5 -/5 Grip  improved on the left and prehension about the same. Upgrade patient to medium firm green putty for gripping and pulling and twisting  Patient to do 15 reps 3 times a day  Pain-free  Can increase to a 2nd and 3rd set over the next 2 weeks.   Patient can also do lateral pinch and to green putty.  12 reps to 15 reps 2 times a day increase reps and sets over the next 3 weeks        PATIENT EDUCATION: Education details: Discharge instructions and home program Person educated: Patient Education method: Explanation, Demonstration, Tactile cues, Verbal cues, and Handouts Education comprehension: verbalized understanding, returned demonstration, verbal cues required, and needs further education    GOALS: Goals  reviewed with patient? Yes   LONG TERM GOALS: Target date: 6 weeks  Patient to be independent in home program to decrease pain to less than a 2/10 at rest. Baseline: No knowledge patient's pain commands 7/10 in thumb and webspace at rest Goal status: Met  2.  Left thumb flexion improved for patient to be able to do opposition to fifth digit symptom-free to initiate doing buttons and sips Baseline: Right thumb IP flexion 20 and MC flexion 15-resting pain 7/10 Goal status: Met  3.  Left thumb palmar radial abduction active range of motion strength improved for patient to be able to pick up a glass and turn a doorknob symptom-free Baseline: Can tolerate lateral loading opening jars twisting and turning doorknobs no pain Goal status: Met  4.  Left thumb fine motor improved for patient to manipulate and retrieve objects from palm to fingertips symptom-free Baseline: Thumb immobilized in thumb spica now within normal limits Goal status: Met  5.  Left t grip) strength improved to within normal range for her age to be able to hold a plate, carry 5 pounds and open to stay symptom-free Baseline: Patient immobilized in thumb spica resting pain 7/10 Goal status: Met  6.  Left wrist  active range of motion strength improved to within normal limits for patient to push and pull heavy door, turn doorknob and return to prior level of function symptom-free Baseline: Wrist flexion 50 and extension 55.  With some discomfort and pain Goal status: Met  ASSESSMENT:  CLINICAL IMPRESSION: Patient seen   for left thumb ulnar collateral ligament repair by Dr. Edie on 03/30/2024.  Patient made great progress in active range of motion in wrist digits and thumb.  Patient still limited in endrange MCP IP flexion 10 degrees and composite opposition.  But not limiting patient functionally.  Strength in thumb palmar radial abduction 5 -/5.  Able to open jars and turn doorknobs and pick up objects.  Strength in wrist 5/5.  Grip improved.  Prehension strength within functional limits.  Did upgrade patient to green firm putty to address gripping, pulling and twisting as well as lateral pinch.  Patient can increase sets and reps over the next couple of weeks.  Patient met all goals and is able to functionally use left hand and thumb in all ADLs.  Patient agreed to be discharged with home program.  PERFORMANCE DEFICITS: in functional skills including ADLs, IADLs, ROM, strength, pain, flexibility, decreased knowledge of use of DME, and UE functional use,   and psychosocial skills including environmental adaptation and routines and behaviors.   IMPAIRMENTS: are limiting patient from ADLs, IADLs, rest and sleep, play, leisure, and social participation.   COMORBIDITIES: has no other co-morbidities that affects occupational performance. Patient will benefit from skilled OT to address above impairments and improve overall function.  MODIFICATION OR ASSISTANCE TO COMPLETE EVALUATION: No modification of tasks or assist necessary to complete an evaluation.  OT OCCUPATIONAL PROFILE AND HISTORY: Problem focused assessment: Including review of records relating to presenting problem.  CLINICAL DECISION MAKING: LOW  - limited treatment options, no task modification necessary  REHAB POTENTIAL: Good for goals  EVALUATION COMPLEXITY: Low   PLAN:  OT FREQUENCY: 3 visits  OT DURATION: 6 weeks  PLANNED INTERVENTIONS: 97168 OT Re-evaluation, 97535 self care/ADL training, 02889 therapeutic exercise, 97530 therapeutic activity, 97112 neuromuscular re-education, 97140 manual therapy, 97035 ultrasound, 97018 paraffin, 02960 fluidotherapy, 97034 contrast bath, 97760 Orthotic Initial, [id number] Orthotic/Prosthetic subsequent, scar mobilization, passive range of motion, patient/family  education, and DME and/or AE instructions  CONSULTED AND AGREED WITH PLAN OF CARE: Patient  Ancel Peters, OTR/L 08/09/2024, 10:31 AM
